# Patient Record
Sex: Male | Born: 1980 | Race: White | Hispanic: No | Marital: Single | Smoking: Current every day smoker
Health system: Southern US, Community
[De-identification: ages and names within clinical notes are randomized; demographics above are authoritative.]

## PROBLEM LIST (undated history)

## (undated) DIAGNOSIS — Z87442 Personal history of urinary calculi: Secondary | ICD-10-CM

## (undated) DIAGNOSIS — N200 Calculus of kidney: Secondary | ICD-10-CM

---

## 2006-12-03 ENCOUNTER — Emergency Department: Payer: Self-pay | Admitting: Internal Medicine

## 2006-12-05 ENCOUNTER — Observation Stay: Payer: Self-pay | Admitting: Unknown Physician Specialty

## 2006-12-05 ENCOUNTER — Emergency Department: Payer: Self-pay | Admitting: Emergency Medicine

## 2011-03-16 ENCOUNTER — Emergency Department: Payer: Self-pay | Admitting: Emergency Medicine

## 2012-10-07 ENCOUNTER — Emergency Department: Payer: Self-pay | Admitting: Emergency Medicine

## 2013-05-22 ENCOUNTER — Emergency Department: Payer: Self-pay | Admitting: Internal Medicine

## 2013-12-19 ENCOUNTER — Emergency Department: Payer: Self-pay | Admitting: Emergency Medicine

## 2014-06-10 ENCOUNTER — Emergency Department: Payer: Self-pay | Admitting: Emergency Medicine

## 2015-03-02 ENCOUNTER — Emergency Department
Admit: 2015-03-02 | Disposition: A | Discharge status: Home / Self Care | Payer: Self-pay | Admitting: Emergency Medicine

## 2015-03-02 LAB — URINALYSIS, COMPLETE
Bilirubin,UR: NEGATIVE
GLUCOSE, UR: NEGATIVE mg/dL (ref 0–75)
Leukocyte Esterase: NEGATIVE
NITRITE: NEGATIVE
PH: 5 (ref 4.5–8.0)
Protein: 30
RBC,UR: 306 /HPF (ref 0–5)
SPECIFIC GRAVITY: 1.025 (ref 1.003–1.030)
Squamous Epithelial: NONE SEEN
WBC UR: 1 /HPF (ref 0–5)

## 2015-03-02 LAB — COMPREHENSIVE METABOLIC PANEL
ALBUMIN: 4.3 g/dL
ANION GAP: 10 (ref 7–16)
AST: 20 U/L
Alkaline Phosphatase: 77 U/L
BUN: 9 mg/dL
Bilirubin,Total: 0.8 mg/dL
CALCIUM: 9.2 mg/dL
Chloride: 107 mmol/L
Co2: 23 mmol/L
Creatinine: 1.03 mg/dL
EGFR (Non-African Amer.): >60
Glucose: 125 mg/dL — ABNORMAL HIGH
POTASSIUM: 3.1 mmol/L — AB
SGPT (ALT): 16 U/L — ABNORMAL LOW
Sodium: 140 mmol/L
Total Protein: 6.9 g/dL

## 2015-03-02 LAB — CBC WITH DIFFERENTIAL/PLATELET
BASOS PCT: 1 %
Basophil #: 0.1 10*3/uL (ref 0.0–0.1)
EOS ABS: 0.1 10*3/uL (ref 0.0–0.7)
Eosinophil %: 1 %
HCT: 47.5 % (ref 40.0–52.0)
HGB: 15.6 g/dL (ref 13.0–18.0)
LYMPHS PCT: 38.8 %
Lymphocyte #: 4.2 10*3/uL — ABNORMAL HIGH (ref 1.0–3.6)
MCH: 29.6 pg (ref 26.0–34.0)
MCHC: 32.9 g/dL (ref 32.0–36.0)
MCV: 90 fL (ref 80–100)
MONO ABS: 0.9 x10 3/mm (ref 0.2–1.0)
Monocyte %: 8.1 %
Neutrophil #: 5.6 10*3/uL (ref 1.4–6.5)
Neutrophil %: 51.1 %
Platelet: 196 10*3/uL (ref 150–440)
RBC: 5.28 10*6/uL (ref 4.40–5.90)
RDW: 12.9 % (ref 11.5–14.5)
WBC: 10.9 10*3/uL — ABNORMAL HIGH (ref 3.8–10.6)

## 2015-03-02 LAB — LIPASE, BLOOD: Lipase: 49 U/L

## 2015-11-25 IMAGING — CT CT ABD-PELV W/ CM
2 of 4 series · 16 of 46 positions shown, 18 images · IV contrast (omnipaque)
Comparison: None.

CLINICAL DATA: Right lower quadrant pain since this morning.
Fevers, chills, no other complaints

EXAM:
CT ABDOMEN AND PELVIS WITH CONTRAST
TECHNIQUE: Multidetector CT imaging of the abdomen and pelvis was performed
using the standard protocol following bolus administration of
intravenous contrast.
CONTRAST:  100 mL Omnipaque 300

[Series 2: routine abd pel with · axial · 0.74mm/px · z∈[-402,+12]mm · 13 of 91 slices shown, 15 images]
[im 4/91  soft-tissue]
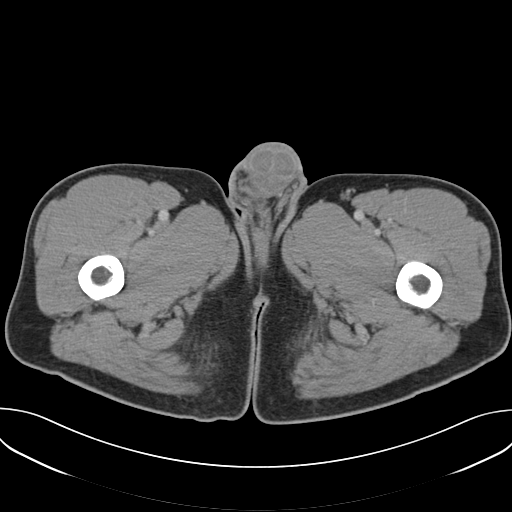
[im 4/91  bone]
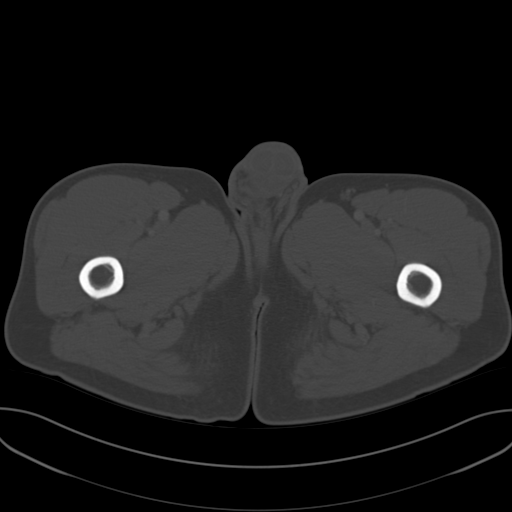
[im 11/91  soft-tissue]
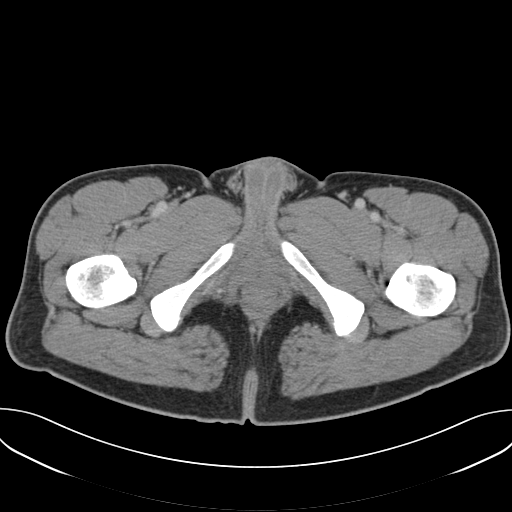
[im 19/91  soft-tissue]
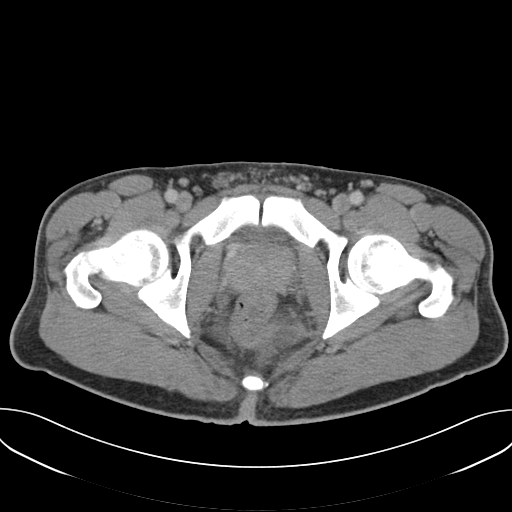
[im 26/91  soft-tissue]
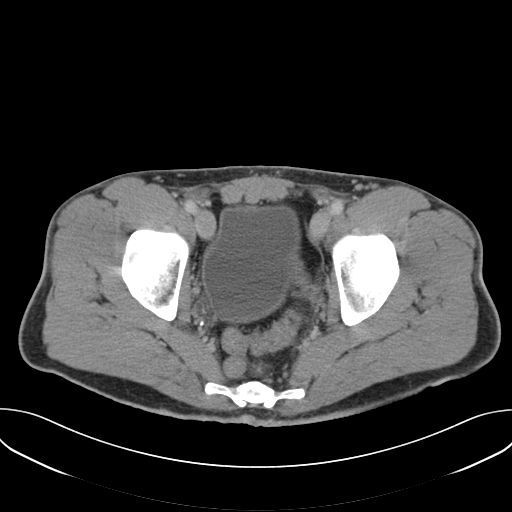
[im 33/91  soft-tissue]
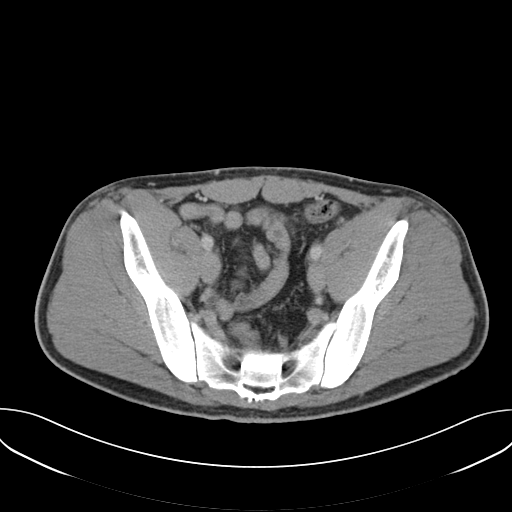
[im 40/91  soft-tissue]
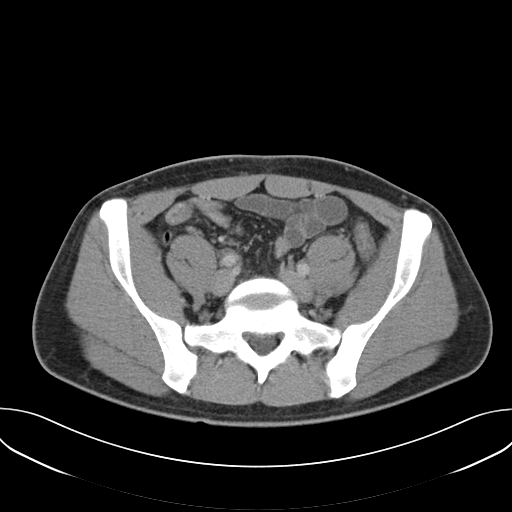
[im 47/91  soft-tissue]
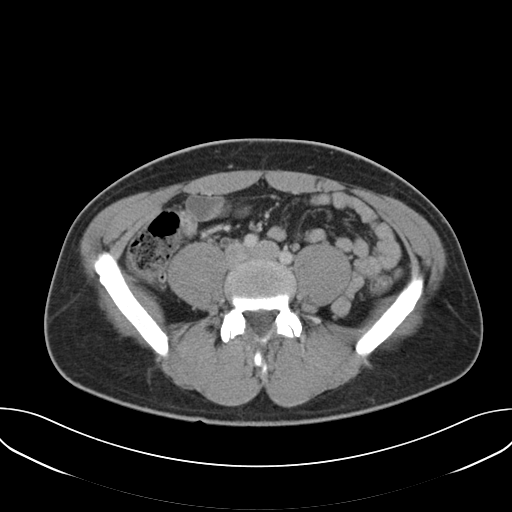
[im 51/91  soft-tissue]
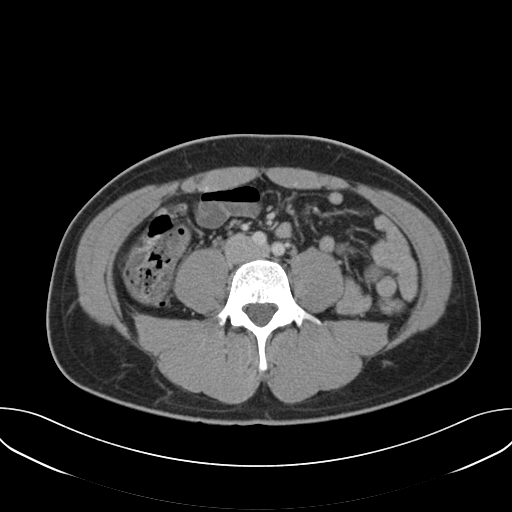
[im 58/91  soft-tissue]
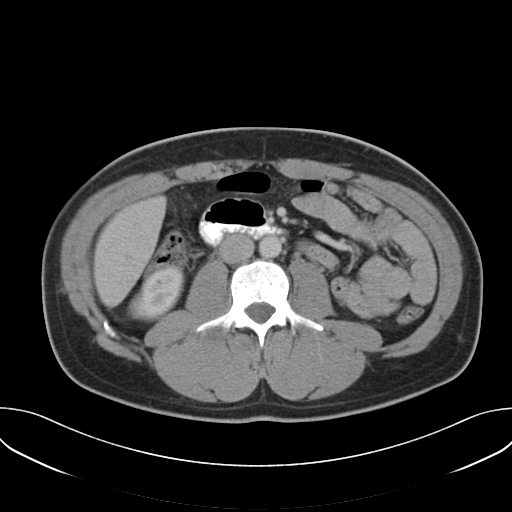
[im 58/91  bone]
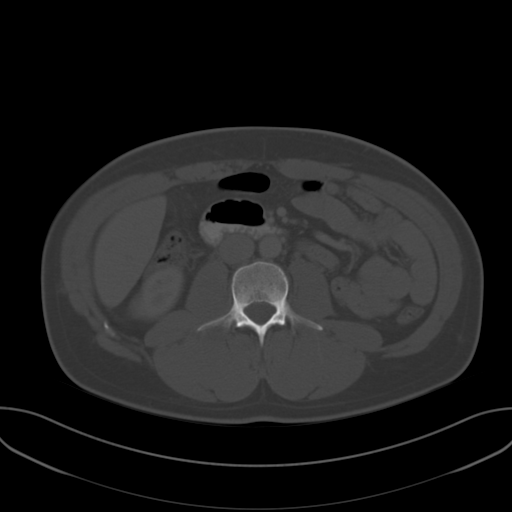
[im 65/91  soft-tissue]
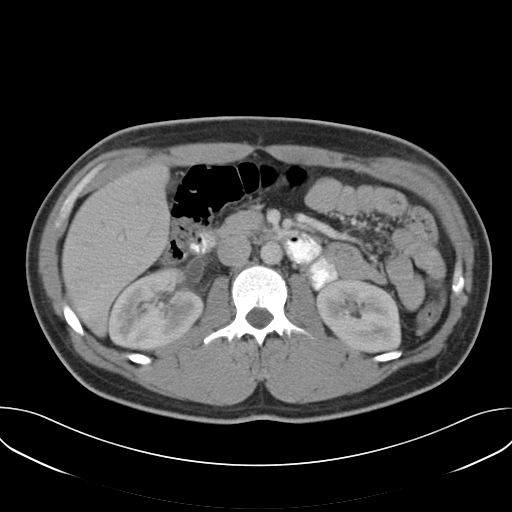
[im 73/91  soft-tissue]
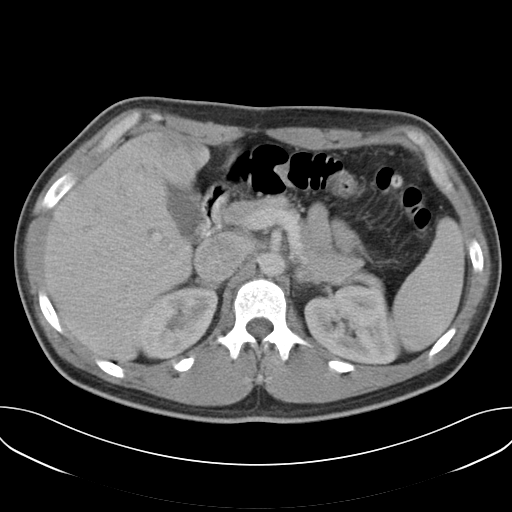
[im 80/91  soft-tissue]
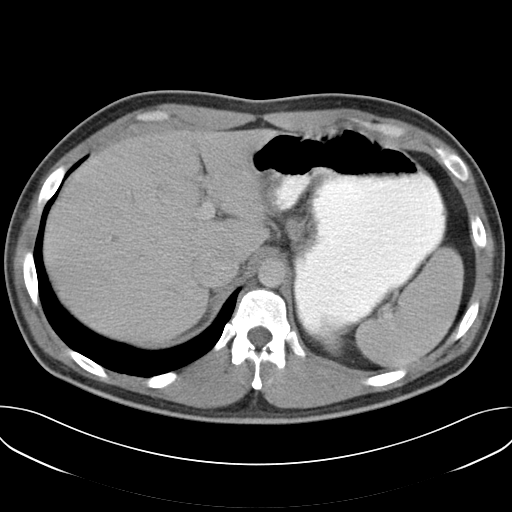
[im 87/91  soft-tissue]
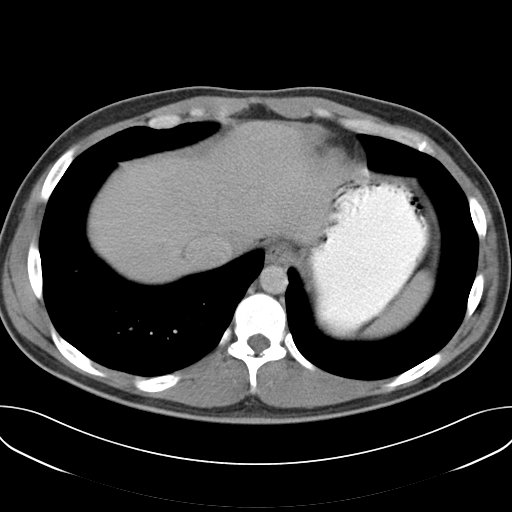

[Series 5: cor routine abd pel with · coronal · 0.70mm/px · 3 of 112 slices shown]
[im 38/112  soft-tissue]
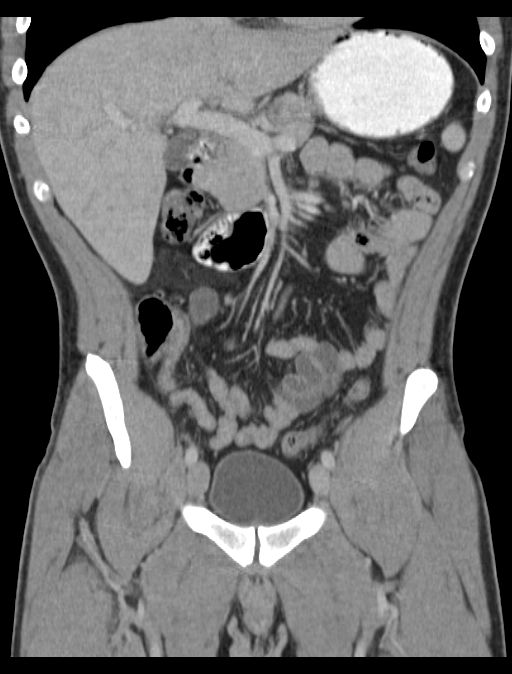
[im 50/112  soft-tissue]
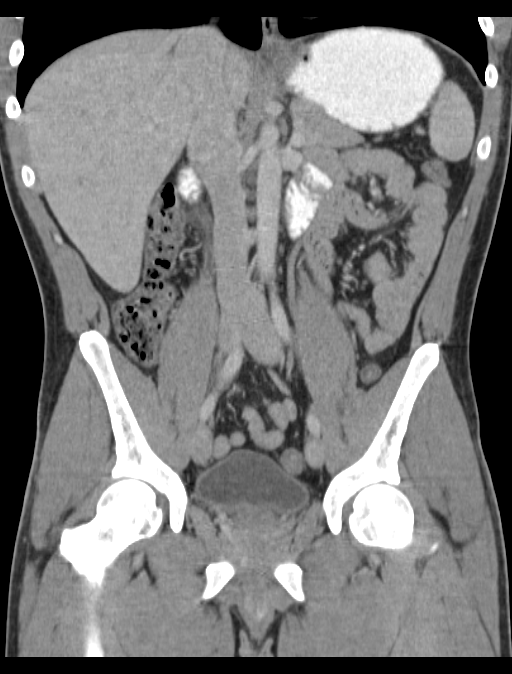
[im 62/112  soft-tissue]
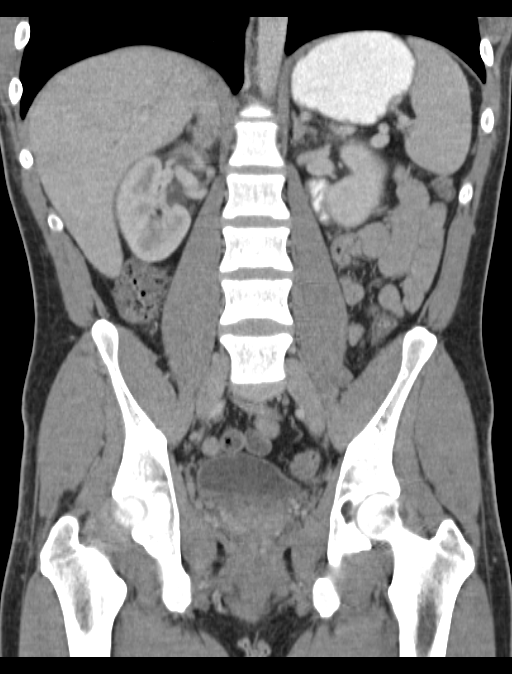

[16 of 46 positions shown; findings below may reference images not displayed]

FINDINGS: The lung bases are clear.

The liver demonstrates no focal abnormality. There is no
intrahepatic or extrahepatic biliary ductal dilatation. The
gallbladder is normal. The spleen demonstrates no focal
abnormality.There is mild right perinephric stranding and mild right
hydroureter with mucosal enhancement. The right kidney enhances
normally homogeneously. There is no urolithiasis. The left kidney,
adrenal glands and pancreas are normal. The bladder is unremarkable.

The stomach, duodenum, small intestine, and large intestine
demonstrate no bowel wall thickening or dilatation There is a normal
caliber appendix in the right lower quadrant without periappendiceal
inflammatory changes. There is no pneumoperitoneum, pneumatosis, or
portal venous gas. There is no abdominal or pelvic free fluid. There
is no lymphadenopathy.

The abdominal aorta is normal in caliber.

There are no lytic or sclerotic osseous lesions.
IMPRESSION: 1. Normal appendix.
2. Mild right perinephric stranding and mild right hydroureter.
Differential considerations include recently passed calculus versus
pyelonephritis. Correlate with laboratory values.

## 2016-01-10 DIAGNOSIS — K047 Periapical abscess without sinus: Secondary | ICD-10-CM | POA: Insufficient documentation

## 2016-01-10 DIAGNOSIS — F17210 Nicotine dependence, cigarettes, uncomplicated: Secondary | ICD-10-CM | POA: Insufficient documentation

## 2016-01-10 DIAGNOSIS — K0889 Other specified disorders of teeth and supporting structures: Secondary | ICD-10-CM | POA: Insufficient documentation

## 2016-01-10 DIAGNOSIS — K0381 Cracked tooth: Secondary | ICD-10-CM | POA: Insufficient documentation

## 2016-01-11 ENCOUNTER — Encounter: Payer: Self-pay | Admitting: Emergency Medicine

## 2016-01-11 ENCOUNTER — Emergency Department
Admission: EM | Admit: 2016-01-11 | Discharge: 2016-01-11 | Disposition: A | Discharge status: Home / Self Care | Payer: Self-pay | Attending: Emergency Medicine | Admitting: Emergency Medicine

## 2016-01-11 DIAGNOSIS — K0889 Other specified disorders of teeth and supporting structures: Secondary | ICD-10-CM

## 2016-01-11 DIAGNOSIS — K047 Periapical abscess without sinus: Secondary | ICD-10-CM

## 2016-01-11 MED ORDER — OXYCODONE-ACETAMINOPHEN 5-325 MG PO TABS: 1.0000 | ORAL_TABLET | ORAL | Status: DC | PRN

## 2016-01-11 MED ORDER — OXYCODONE-ACETAMINOPHEN 5-325 MG PO TABS
1.0000 | ORAL_TABLET | Freq: Once | ORAL | Status: AC
  Administered 2016-01-11: 1 via ORAL
  Filled 2016-01-11: qty 1

## 2016-01-11 MED ORDER — IBUPROFEN 800 MG PO TABS: 800.0000 mg | ORAL_TABLET | Freq: Three times a day (TID) | ORAL | Status: DC | PRN

## 2016-01-11 MED ORDER — PENICILLIN V POTASSIUM 500 MG PO TABS: 500.0000 mg | ORAL_TABLET | Freq: Four times a day (QID) | ORAL | Status: DC

## 2016-01-11 MED ORDER — IBUPROFEN 800 MG PO TABS
800.0000 mg | ORAL_TABLET | Freq: Once | ORAL | Status: AC
  Administered 2016-01-11: 800 mg via ORAL
  Filled 2016-01-11: qty 1

## 2016-01-11 MED ORDER — PENICILLIN V POTASSIUM 250 MG PO TABS
500.0000 mg | ORAL_TABLET | Freq: Once | ORAL | Status: AC
  Administered 2016-01-11: 500 mg via ORAL
  Filled 2016-01-11: qty 2

## 2016-01-11 NOTE — ED Provider Notes (Signed)
[facility] Provider Note  ____________________________________________  Time seen: Approximately 5:44 AM  I have reviewed the triage vital signs and the nursing notes.   HISTORY  Chief Complaint Dental Pain    HPI COPE MARTE is a 35 y.o. male who presents to the ED from home with a chief complaint of dental pain. Patient states he chipped his right lower tooth 2 nights ago on hard candy. Complains of pain and swelling to his right lower jaw. Denies fever, chills, chest pain, shortness of breath, abdominal pain, nausea, vomiting, diarrhea. Denies recent travel or trauma.   Past medical history None  There are no active problems to display for this patient.   History reviewed. No pertinent past surgical history.  No current outpatient prescriptions on file.  Allergies Review of patient's allergies indicates no known allergies.  No family history on file.  Social History Social History  Substance Use Topics  . Smoking status: Current Every Day Smoker -- 0.00 packs/day    Types: Cigarettes  . Smokeless tobacco: None  . Alcohol Use: No    Review of Systems Constitutional: No fever/chills Eyes: No visual changes. ENT: Positive for dental pain. No sore throat. Cardiovascular: Denies chest pain. Respiratory: Denies shortness of breath. Gastrointestinal: No abdominal pain.  No nausea, no vomiting.  No diarrhea.  No constipation. Genitourinary: Negative for dysuria. Musculoskeletal: Negative for back pain. Skin: Negative for rash. Neurological: Negative for headaches, focal weakness or numbness.  10-point ROS otherwise negative.  ____________________________________________   PHYSICAL EXAM:  VITAL SIGNS: ED Triage Vitals  Enc Vitals Group     BP 01/10/16 2358 146/96 mmHg     Pulse Rate 01/10/16 2358 58     Resp 01/10/16 2358 18     Temp 01/10/16 2358 98.1 F (36.7 C)     Temp Source 01/10/16 2358 Oral   SpO2 01/10/16 2358 100 %     Weight 01/10/16 2358 175 lb (79.379 kg)     Height 01/10/16 2358  (1.803 m)     Head Cir --      Peak Flow --      Pain Score 01/11/16 0007 9     Pain Loc --      Pain Edu? --      Excl. in [place]? --     Constitutional: Alert and oriented. Well appearing and in no acute distress. Eyes: Conjunctivae are normal. PERRL. EOMI. Head: Atraumatic. Nose: No congestion/rhinnorhea. Mouth/Throat: Mucous membranes are moist.  Oropharynx non-erythematous.  Right lower molar with fracture and missing piece. No abscess along the gumline. Right jaw with mild swelling. Neck: No stridor.   Cardiovascular: Normal rate, regular rhythm. Grossly normal heart sounds.  Good peripheral circulation. Respiratory: Normal respiratory effort.  No retractions. Lungs CTAB. Gastrointestinal: Soft and nontender. No distention. No abdominal bruits. No CVA tenderness. Musculoskeletal: No lower extremity tenderness nor edema.  No joint effusions. Neurologic:  Normal speech and language. No gross focal neurologic deficits are appreciated. No gait instability. Skin:  Skin is warm, dry and intact. No rash noted. Psychiatric: Mood and affect are normal. Speech and behavior are normal.  ____________________________________________   LABS (all labs ordered are listed, but only abnormal results are displayed)  Labs Reviewed - No data to display ____________________________________________  EKG  None ____________________________________________  RADIOLOGY  None ____________________________________________   PROCEDURES  Procedure(s) performed: None  Critical Care performed: No  ____________________________________________   INITIAL IMPRESSION / ASSESSMENT AND PLAN / ED COURSE  Pertinent labs & imaging results that were available during my care of the patient were reviewed by me and considered in my medical decision making (see chart for details).  35 year old male with  dentalgia secondary to broken tooth. Will treat with antibiotic, analgesia and patient to follow-up with dentist. He was given a list of local dental clinics to call for follow-up appointment. Strict return precautions given. Patient verbalizes understanding and agrees with plan of care. ____________________________________________   FINAL CLINICAL IMPRESSION(S) / ED DIAGNOSES  Final diagnoses:  Dentalgia  Dental abscess      Irean Hong, MD 01/11/16 [phone]

## 2016-01-11 NOTE — ED Notes (Signed)
Patient states that he chipped a tooth Friday while eating. Patient reports now with pain and swelling to right lower jaw.

## 2016-01-11 NOTE — Discharge Instructions (Signed)
1. Take antibiotic as prescribed (Penicillin VK  four times daily x 7 days). 2. Take pain medicines as needed (Motrin/Percocet #15). 3. Return to the ER for worsening symptoms, persistent vomiting, fever, difficulty breathing or other concerns.  Dental Abscess A dental abscess is a collection of pus in or around a tooth. CAUSES This condition is caused by a bacterial infection around the root of the tooth that involves the inner part of the tooth (pulp). It may result from:  Severe tooth decay.  Trauma to the tooth that allows bacteria to enter into the pulp, such as a broken or chipped tooth.  Severe gum disease around a tooth. SYMPTOMS Symptoms of this condition include:  Severe pain in and around the infected tooth.  Swelling and redness around the infected tooth, in the mouth, or in the face.  Tenderness.  Pus drainage.  Bad breath.  Bitter taste in the mouth.  Difficulty swallowing.  Difficulty opening the mouth.  Nausea.  Vomiting.  Chills.  Swollen neck glands.  Fever. DIAGNOSIS This condition is diagnosed with examination of the infected tooth. During the exam, your dentist may tap on the infected tooth. Your dentist will also ask about your medical and dental history and may order X-rays. TREATMENT This condition is treated by eliminating the infection. This may be done with:  Antibiotic medicine.  A root canal. This may be performed to save the tooth.  Pulling (extracting) the tooth. This may also involve draining the abscess. This is done if the tooth cannot be saved. HOME CARE INSTRUCTIONS  Take medicines only as directed by your dentist.  If you were prescribed antibiotic medicine, finish all of it even if you start to feel better.  Rinse your mouth (gargle) often with salt water to relieve pain or swelling.  Do not drive or operate heavy machinery while taking pain medicine.  Do not apply heat to the outside of your mouth.  Keep all  follow-up visits as directed by your dentist. This is important. SEEK MEDICAL CARE IF:  Your pain is worse and is not helped by medicine. SEEK IMMEDIATE MEDICAL CARE IF:  You have a fever or chills.  Your symptoms suddenly get worse.  You have a very bad headache.  You have problems breathing or swallowing.  You have trouble opening your mouth.  You have swelling in your neck or around your eye.   This information is not intended to replace advice given to you by your health care provider. Make sure you discuss any questions you have with your health care provider.   Document Released: 11/14/2005 Document Revised: 03/31/2015 Document Reviewed: 11/11/2014 Elsevier Interactive Patient Education 2016 [employer]  Dental Pain Dental pain may be caused by many things, including:  Tooth decay (cavities or caries). Cavities expose the nerve of your tooth to air and hot or cold temperatures. This can cause pain or discomfort.  Abscess or infection. A dental abscess is a collection of infected pus from a bacterial infection in the inner part of the tooth (pulp). It usually occurs at the end of the tooth's root.  Injury.  An unknown reason (idiopathic). Your pain may be mild or severe. It may only occur when:  You are chewing.  You are exposed to hot or cold temperature.  You are eating or drinking sugary foods or beverages, such as soda or candy. Your pain may also be constant. HOME CARE INSTRUCTIONS Watch your dental pain for any changes. The following actions may help  to lessen any discomfort that you are feeling:  Take medicines only as directed by your dentist.  If you were prescribed an antibiotic medicine, finish all of it even if you start to feel better.  Keep all follow-up visits as directed by your dentist. This is important.  Do not apply heat to the outside of your face.  Rinse your mouth or gargle with salt water if directed by your dentist. This helps with  pain and swelling.  You can make salt water by adding  tsp of salt to 1 cup of warm water.  Apply ice to the painful area of your face:  Put ice in a plastic bag.  Place a towel between your skin and the bag.  Leave the ice on for 20 minutes, 2-3 times per day.  Avoid foods or drinks that cause you pain, such as:  Very hot or very cold foods or drinks.  Sweet or sugary foods or drinks. SEEK MEDICAL CARE IF:  Your pain is not controlled with medicines.  Your symptoms are worse.  You have new symptoms. SEEK IMMEDIATE MEDICAL CARE IF:  You are unable to open your mouth.  You are having trouble breathing or swallowing.  You have a fever.  Your face, neck, or jaw is swollen.   This information is not intended to replace advice given to you by your health care provider. Make sure you discuss any questions you have with your health care provider.   Document Released: 11/14/2005 Document Revised: 03/31/2015 Document Reviewed: 11/10/2014 Elsevier Interactive Patient Education [employer]

## 2022-01-07 ENCOUNTER — Emergency Department: Payer: Self-pay

## 2022-01-07 ENCOUNTER — Encounter: Payer: Self-pay | Admitting: Emergency Medicine

## 2022-01-07 ENCOUNTER — Other Ambulatory Visit: Payer: Self-pay

## 2022-01-07 ENCOUNTER — Emergency Department
Admission: EM | Admit: 2022-01-07 | Discharge: 2022-01-07 | Disposition: A | Discharge status: Home / Self Care | Payer: Self-pay | Attending: Emergency Medicine | Admitting: Emergency Medicine

## 2022-01-07 DIAGNOSIS — R319 Hematuria, unspecified: Secondary | ICD-10-CM | POA: Insufficient documentation

## 2022-01-07 DIAGNOSIS — Z87891 Personal history of nicotine dependence: Secondary | ICD-10-CM | POA: Insufficient documentation

## 2022-01-07 DIAGNOSIS — N23 Unspecified renal colic: Secondary | ICD-10-CM | POA: Insufficient documentation

## 2022-01-07 DIAGNOSIS — I10 Essential (primary) hypertension: Secondary | ICD-10-CM | POA: Insufficient documentation

## 2022-01-07 DIAGNOSIS — N132 Hydronephrosis with renal and ureteral calculous obstruction: Secondary | ICD-10-CM | POA: Insufficient documentation

## 2022-01-07 DIAGNOSIS — R112 Nausea with vomiting, unspecified: Secondary | ICD-10-CM | POA: Insufficient documentation

## 2022-01-07 HISTORY — DX: Calculus of kidney: N20.0

## 2022-01-07 LAB — COMPREHENSIVE METABOLIC PANEL
ALT: 39 U/L (ref 0–44)
AST: 25 U/L (ref 15–41)
Albumin: 4.3 g/dL (ref 3.5–5.0)
Alkaline Phosphatase: 90 U/L (ref 38–126)
Anion gap: 9 (ref 5–15)
BUN: 19 mg/dL (ref 6–20)
CO2: 22 mmol/L (ref 22–32)
Calcium: 9.4 mg/dL (ref 8.9–10.3)
Chloride: 107 mmol/L (ref 98–111)
Creatinine, Ser: 1.32 mg/dL — ABNORMAL HIGH (ref 0.61–1.24)
GFR, Estimated: >60 mL/min (ref >60)
Glucose, Bld: 136 mg/dL — ABNORMAL HIGH (ref 70–99)
Potassium: 3.7 mmol/L (ref 3.5–5.1)
Sodium: 138 mmol/L (ref 135–145)
Total Bilirubin: 0.7 mg/dL (ref 0.3–1.2)
Total Protein: 7.2 g/dL (ref 6.5–8.1)

## 2022-01-07 LAB — CBC WITH DIFFERENTIAL/PLATELET
Abs Immature Granulocytes: 0.17 10*3/uL — ABNORMAL HIGH (ref 0.00–0.07)
Basophils Absolute: 0.1 10*3/uL (ref 0.0–0.1)
Basophils Relative: 0 %
Eosinophils Absolute: 0.1 10*3/uL (ref 0.0–0.5)
Eosinophils Relative: 0 %
HCT: 43.7 % (ref 39.0–52.0)
Hemoglobin: 15.4 g/dL (ref 13.0–17.0)
Immature Granulocytes: 1 %
Lymphocytes Relative: 20 %
Lymphs Abs: 3.7 10*3/uL (ref 0.7–4.0)
MCH: 30.9 pg (ref 26.0–34.0)
MCHC: 35.2 g/dL (ref 30.0–36.0)
MCV: 87.8 fL (ref 80.0–100.0)
Monocytes Absolute: 1.2 10*3/uL — ABNORMAL HIGH (ref 0.1–1.0)
Monocytes Relative: 7 %
Neutro Abs: 13.3 10*3/uL — ABNORMAL HIGH (ref 1.7–7.7)
Neutrophils Relative %: 72 %
Platelets: 258 10*3/uL (ref 150–400)
RBC: 4.98 MIL/uL (ref 4.22–5.81)
RDW: 11.9 % (ref 11.5–15.5)
WBC: 18.5 10*3/uL — ABNORMAL HIGH (ref 4.0–10.5)
nRBC: 0 % (ref 0.0–0.2)

## 2022-01-07 LAB — URINALYSIS, ROUTINE W REFLEX MICROSCOPIC
Bacteria, UA: NONE SEEN
Bilirubin Urine: NEGATIVE
Glucose, UA: NEGATIVE mg/dL
Ketones, ur: NEGATIVE mg/dL
Leukocytes,Ua: NEGATIVE
Nitrite: NEGATIVE
Protein, ur: NEGATIVE mg/dL
RBC / HPF: >50 RBC/hpf — ABNORMAL HIGH (ref 0–5)
Specific Gravity, Urine: 1.008 (ref 1.005–1.030)
pH: 5 (ref 5.0–8.0)

## 2022-01-07 MED ORDER — MORPHINE SULFATE (PF) 4 MG/ML IV SOLN
4.0000 mg | Freq: Once | INTRAVENOUS | Status: AC
  Administered 2022-01-07: 4 mg via INTRAVENOUS
  Filled 2022-01-07: qty 1

## 2022-01-07 MED ORDER — ONDANSETRON HCL 4 MG/2ML IJ SOLN
4.0000 mg | INTRAMUSCULAR | Status: AC
  Administered 2022-01-07: 4 mg via INTRAVENOUS
  Filled 2022-01-07: qty 2

## 2022-01-07 MED ORDER — ONDANSETRON 4 MG PO TBDP: ORAL_TABLET | ORAL | 0 refills | Status: AC

## 2022-01-07 MED ORDER — HYDROCODONE-ACETAMINOPHEN 5-325 MG PO TABS
2.0000 | ORAL_TABLET | Freq: Once | ORAL | Status: AC
  Administered 2022-01-07: 2 via ORAL
  Filled 2022-01-07: qty 2

## 2022-01-07 MED ORDER — OXYCODONE-ACETAMINOPHEN 5-325 MG PO TABS: 2.0000 | ORAL_TABLET | Freq: Three times a day (TID) | ORAL | 0 refills | Status: DC | PRN

## 2022-01-07 MED ORDER — LACTATED RINGERS IV BOLUS
1000.0000 mL | Freq: Once | INTRAVENOUS | Status: AC
  Administered 2022-01-07: 1000 mL via INTRAVENOUS

## 2022-01-07 MED ORDER — KETOROLAC TROMETHAMINE 30 MG/ML IJ SOLN
15.0000 mg | Freq: Once | INTRAMUSCULAR | Status: AC
  Administered 2022-01-07: 15 mg via INTRAVENOUS
  Filled 2022-01-07: qty 1

## 2022-01-07 NOTE — ED Provider Notes (Signed)
[facility] Provider Note    Event Date/Time   First MD Initiated Contact with Patient 01/07/22 0129     (approximate)   History   Abdominal Pain   HPI  [facility] is a 41 y.o. male with prior history of kidney stones as well as tobacco use but no other contributory past medical history.  He presents by EMS for evaluation of right-sided pain and pressure that radiates from his flank down to his pelvic area.  He has had more difficulty than usual urinating although he has been able to pee multiple times.  He said that it does not hurt to do so but sometimes it is darker than others.  It feels similar to prior kidney stones.  He has never required surgery to pass kidney stones.  He reports that the symptoms started about 2 days ago.  Yesterday he went to an urgent care and [facility].  They identified blood in his urine and started him on Flomax but no other medications.  By tonight, the episodes of pain were getting more frequent and became severe.  He has had nausea and vomiting associated with the symptoms.  No fever, chest pain, nor shortness of breath.  No dysuria.     Physical Exam   ED Triage Vitals  Enc Vitals Group     BP 01/07/22 0203 (!) 143/95     Pulse Rate 01/07/22 0203 86     Resp 01/07/22 0203 18     Temp --      Temp Source 01/07/22 0203 Oral     SpO2 01/07/22 0203 99 %     Weight 01/07/22 0050 100.7 kg (222 lb)     Height 01/07/22 0050 1.803 m (5\' 11" )     Head Circumference --      Peak Flow --      Pain Score 01/07/22 0050 9     Pain Loc --      Pain Edu? --      Excl. in [place]? --      Most recent vital signs: Vitals:   01/07/22 0203  BP: (!) 143/95  Pulse: 86  Resp: 18  SpO2: 99%     General: Awake, no distress.  Appears uncomfortable but not in severe distress. CV:  Good peripheral perfusion.  Resp:  Normal effort.  Abd:  No distention.  Mild tenderness to palpation of the right side of the abdomen with right  flank tenderness to percussion.   ED Results / Procedures / Treatments   Labs (all labs ordered are listed, but only abnormal results are displayed) Labs Reviewed  CBC WITH DIFFERENTIAL/PLATELET - Abnormal; Notable for the following components:      Result Value   WBC 18.5 (*)    Neutro Abs 13.3 (*)    Monocytes Absolute 1.2 (*)    Abs Immature Granulocytes 0.17 (*)    All other components within normal limits  URINALYSIS, ROUTINE W REFLEX MICROSCOPIC - Abnormal; Notable for the following components:   Color, Urine STRAW (*)    APPearance CLEAR (*)    Hgb urine dipstick LARGE (*)    RBC / HPF >50 (*)    All other components within normal limits  COMPREHENSIVE METABOLIC PANEL - Abnormal; Notable for the following components:   Glucose, Bld 136 (*)    Creatinine, Ser 1.32 (*)    All other components within normal limits    RADIOLOGY See hospital course for details -patient has  a 3 mm distal ureteral stone in the right UVJ.  Normal-appearing appendix.   PROCEDURES:  Critical Care performed: No  Procedures   MEDICATIONS ORDERED IN ED: Medications  HYDROcodone-acetaminophen (NORCO/VICODIN) 5-325 MG per tablet 2 tablet (has no administration in time range)  lactated ringers bolus 1,000 mL (0 mLs Intravenous Stopped 01/07/22 0301)  morphine (PF) 4 MG/ML injection 4 mg (4 mg Intravenous Given 01/07/22 0224)  ondansetron (ZOFRAN) injection 4 mg (4 mg Intravenous Given 01/07/22 0221)  ketorolac (TORADOL) 30 MG/ML injection 15 mg (15 mg Intravenous Given 01/07/22 0222)     IMPRESSION / MDM / ASSESSMENT AND PLAN / ED COURSE  I reviewed the triage vital signs and the nursing notes.                              Differential diagnosis includes, but is not limited to, renal/ureteral stone, UTI/pyelonephritis, infected stone, appendicitis.  The patient is on the cardiac monitor to evaluate for evidence of arrhythmia and/or significant heart rate changes.  Vital signs stable and  within normal limits other than very mild hypertension.  Patient is uncomfortable but nontoxic in appearance.  Labs ordered initially include urinalysis, comprehensive metabolic panel, and CBC.  I reviewed the patient's results.  His urinalysis demonstrates hematuria but no evidence of acute infection.  Comprehensive metabolic panel is essentially normal but he does have a very slight elevation of his creatinine of 1.32 which could represent some volume depletion/acute kidney injury in the setting of nausea and vomiting and acute pain.  CBC is notable for leukocytosis of 18.5.  I believe this is likely reactive given that he is young and otherwise healthy and has had several days of episodic pain that appears to be due to a ureteral stone.  Again, he has no infectious symptoms, and his urinalysis was reassuring.  A proximal infection is possible but unlikely in this particular case, particularly given his overall well appearance, afebrile, no tachycardia.  CT renal stone protocol was ordered.  I personally reviewed the images and identified what could be a ureteral stone.  The radiologist confirmed that the patient has a 3 mm distal right UVJ stone with moderate hydronephrosis.  No other concerning abnormalities including a normal-appearing appendix.  I have dated the patient regarding his diagnosis.  Plan of care is LR 1 L IV bolus, morphine 4 mg IV, Toradol 15 mg IV, and reassessment.  Anticipate discharge with outpatient follow-up.  Patient agrees with the current plan.  Clinical Course as of 01/07/22 0355  Fri Jan 07, 2022  [id number] The patient said he feels a lot better.  He can still feel a little bit of the pain but not much.  He is no longer nauseated or vomiting.  He has been adequately rehydrated, continues to have stable vital signs, and his pain is much improved.  I reiterated my usual kidney stone management and follow-up recommendations, and return precautions.  He understands and agrees  with the plan for discharge.  Medications as listed below including 2 Norco prior to discharge to try to tide him over until he can go to the pharmacy. [CF]    Clinical Course User Index [CF] Hinda Kehr, MD     FINAL CLINICAL IMPRESSION(S) / ED DIAGNOSES   Final diagnoses:  Renal colic on right side  Ureteral stone with hydronephrosis     Rx / DC Orders   ED Discharge Orders  Ordered    oxyCODONE-acetaminophen (PERCOCET) 5-325 MG tablet  Every 8 hours PRN        01/07/22 0355    ondansetron (ZOFRAN-ODT) 4 MG disintegrating tablet        01/07/22 0355             Note:  This document was prepared using Dragon voice recognition software and may include unintentional dictation errors.   Hinda Kehr, MD 01/07/22 [phone]

## 2022-01-07 NOTE — ED Notes (Signed)
Pt states he has been having flank pain and pain urinating since Wed. Pt does have a hx of kidney stone and denies having any surgery for it.

## 2022-01-07 NOTE — Discharge Instructions (Signed)
You have been seen in the Emergency Department (ED) today for pain caused by kidney stones.  As we have discussed, please drink plenty of fluids.  Please make a follow up appointment with the physician(s) listed elsewhere in this documentation.  You may take pain medication as needed but ONLY as prescribed.  Please also take your prescribed Flomax daily.  If you are going to follow up with Urology for possible lithotripsy, please do not take any ibuprofen, naproxen, aspirin, Toradol, or other NSAID after Tuesday morning, as this may exclude you from lithotripsy.  Please see your doctor as soon as possible as stones may take 1-3 weeks to pass and you may require additional care or medications.  Do not drink alcohol, drive or participate in any other potentially dangerous activities while taking opiate pain medication as it may make you sleepy. Do not take this medication with any other sedating medications, either prescription or over-the-counter. If you were prescribed Percocet or Vicodin, do not take these with acetaminophen (Tylenol) as it is already contained within these medications.   Take Percocet as needed for severe pain.  This medication is an opiate (or narcotic) pain medication and can be habit forming.  Use it as little as possible to achieve adequate pain control.  Do not use or use it with extreme caution if you have a history of opiate abuse or dependence.  If you are on a pain contract with your primary care doctor or a pain specialist, be sure to let them know you were prescribed this medication today from the [facility].  This medication is intended for your use only - do not give any to anyone else and keep it in a secure place where nobody else, especially children, have access to it.  It will also cause or worsen constipation, so you may want to consider taking an over-the-counter stool softener while you are taking this medication.  Return to the Emergency  Department (ED) or call your doctor if you have any worsening pain, fever, painful urination, are unable to urinate, or develop other symptoms that concern you.  

## 2022-01-07 NOTE — ED Triage Notes (Signed)
Pt to triage via w/c with no distress noted, brought in by EMS from home for c/o genital pain "pressure" x 2 days radiating into pelvic area accomp by urinary retention; rx flomax yesterday; hx kidney stone

## 2022-01-15 ENCOUNTER — Emergency Department
Admission: EM | Admit: 2022-01-15 | Discharge: 2022-01-15 | Disposition: A | Discharge status: Home / Self Care | Payer: Self-pay | Attending: Emergency Medicine | Admitting: Emergency Medicine

## 2022-01-15 ENCOUNTER — Other Ambulatory Visit: Payer: Self-pay

## 2022-01-15 ENCOUNTER — Encounter: Payer: Self-pay | Admitting: Emergency Medicine

## 2022-01-15 ENCOUNTER — Emergency Department: Payer: Self-pay

## 2022-01-15 DIAGNOSIS — F17200 Nicotine dependence, unspecified, uncomplicated: Secondary | ICD-10-CM | POA: Insufficient documentation

## 2022-01-15 DIAGNOSIS — N132 Hydronephrosis with renal and ureteral calculous obstruction: Secondary | ICD-10-CM | POA: Insufficient documentation

## 2022-01-15 DIAGNOSIS — N200 Calculus of kidney: Secondary | ICD-10-CM

## 2022-01-15 LAB — COMPREHENSIVE METABOLIC PANEL
ALT: 29 U/L (ref 0–44)
AST: 20 U/L (ref 15–41)
Albumin: 4.3 g/dL (ref 3.5–5.0)
Alkaline Phosphatase: 90 U/L (ref 38–126)
Anion gap: 7 (ref 5–15)
BUN: 14 mg/dL (ref 6–20)
CO2: 27 mmol/L (ref 22–32)
Calcium: 9.7 mg/dL (ref 8.9–10.3)
Chloride: 107 mmol/L (ref 98–111)
Creatinine, Ser: 1.06 mg/dL (ref 0.61–1.24)
GFR, Estimated: >60 mL/min (ref >60)
Glucose, Bld: 115 mg/dL — ABNORMAL HIGH (ref 70–99)
Potassium: 3.5 mmol/L (ref 3.5–5.1)
Sodium: 141 mmol/L (ref 135–145)
Total Bilirubin: 0.9 mg/dL (ref 0.3–1.2)
Total Protein: 7.3 g/dL (ref 6.5–8.1)

## 2022-01-15 LAB — URINALYSIS, COMPLETE (UACMP) WITH MICROSCOPIC
Bacteria, UA: NONE SEEN
Bilirubin Urine: NEGATIVE
Glucose, UA: NEGATIVE mg/dL
Ketones, ur: NEGATIVE mg/dL
Leukocytes,Ua: NEGATIVE
Nitrite: NEGATIVE
Protein, ur: 30 mg/dL — AB
Specific Gravity, Urine: 1.023 (ref 1.005–1.030)
Squamous Epithelial / HPF: NONE SEEN (ref 0–5)
pH: 5 (ref 5.0–8.0)

## 2022-01-15 LAB — CBC WITH DIFFERENTIAL/PLATELET
Abs Immature Granulocytes: 0.04 10*3/uL (ref 0.00–0.07)
Basophils Absolute: 0 10*3/uL (ref 0.0–0.1)
Basophils Relative: 0 %
Eosinophils Absolute: 0.3 10*3/uL (ref 0.0–0.5)
Eosinophils Relative: 2 %
HCT: 46.4 % (ref 39.0–52.0)
Hemoglobin: 15.9 g/dL (ref 13.0–17.0)
Immature Granulocytes: 0 %
Lymphocytes Relative: 33 %
Lymphs Abs: 3.4 10*3/uL (ref 0.7–4.0)
MCH: 30 pg (ref 26.0–34.0)
MCHC: 34.3 g/dL (ref 30.0–36.0)
MCV: 87.5 fL (ref 80.0–100.0)
Monocytes Absolute: 0.7 10*3/uL (ref 0.1–1.0)
Monocytes Relative: 7 %
Neutro Abs: 5.9 10*3/uL (ref 1.7–7.7)
Neutrophils Relative %: 58 %
Platelets: 309 10*3/uL (ref 150–400)
RBC: 5.3 MIL/uL (ref 4.22–5.81)
RDW: 11.7 % (ref 11.5–15.5)
WBC: 10.3 10*3/uL (ref 4.0–10.5)
nRBC: 0 % (ref 0.0–0.2)

## 2022-01-15 MED ORDER — KETOROLAC TROMETHAMINE 30 MG/ML IJ SOLN
15.0000 mg | Freq: Once | INTRAMUSCULAR | Status: AC
  Administered 2022-01-15: 15 mg via INTRAVENOUS
  Filled 2022-01-15: qty 1

## 2022-01-15 MED ORDER — ACETAMINOPHEN 500 MG PO TABS
1000.0000 mg | ORAL_TABLET | Freq: Once | ORAL | Status: AC
  Administered 2022-01-15: 1000 mg via ORAL
  Filled 2022-01-15: qty 2

## 2022-01-15 MED ORDER — OXYCODONE-ACETAMINOPHEN 5-325 MG PO TABS: 1.0000 | ORAL_TABLET | Freq: Three times a day (TID) | ORAL | 0 refills | Status: AC | PRN

## 2022-01-15 MED ORDER — MORPHINE SULFATE (PF) 4 MG/ML IV SOLN
4.0000 mg | Freq: Once | INTRAVENOUS | Status: AC
  Administered 2022-01-15: 4 mg via INTRAVENOUS
  Filled 2022-01-15: qty 1

## 2022-01-15 MED ORDER — ONDANSETRON HCL 4 MG/2ML IJ SOLN
4.0000 mg | Freq: Once | INTRAMUSCULAR | Status: AC
  Administered 2022-01-15: 4 mg via INTRAVENOUS
  Filled 2022-01-15: qty 2

## 2022-01-15 MED ORDER — LACTATED RINGERS IV BOLUS
1000.0000 mL | Freq: Once | INTRAVENOUS | Status: AC
  Administered 2022-01-15: 1000 mL via INTRAVENOUS

## 2022-01-15 NOTE — ED Triage Notes (Signed)
Pt via POV from home. Pt c/o RLQ pain that stated yesterday states that he was here on 2/10 dx with kidney stones, pt states he doesn't think he has passed any. Pt endorses nausea. Pt radiates down to his R testicle. Pt is A&OX4 and NAD.

## 2022-01-15 NOTE — ED Provider Notes (Signed)
[facility] Provider Note    Event Date/Time   First MD Initiated Contact with Patient 01/15/22 1042     (approximate)   History   Abdominal Pain   HPI  Zachary Huff is a 41 y.o. male with a past medical history of tobacco abuse and kidney stones most recently seen for acute right-sided low back pain radiating around right flank on 2/10 having been diagnosed with acute distal right ureteral stone 3 mm some mild right hydronephrosis discharged on Percocet presents for assessment of some persistent right-sided low back pain rating down the flank towards the groin associate with nausea and vomiting.  He states he has been taking Percocet and Flomax but this does not help much.  No fevers, chest pain, left-sided symptoms, cough, shortness of breath, headache, earache, sore throat or diarrhea.  States he sometimes feels like his whole brain and the urine but no gross blood.  No other acute concerns at this time.  He has not been taking any other pain medications.       Physical Exam  Triage Vital Signs: ED Triage Vitals [01/15/22 1038]  Enc Vitals Group     BP      Pulse      Resp      Temp      Temp src      SpO2      Weight 220 lb (99.8 kg)     Height 5\' 11"  (1.803 m)     Head Circumference      Peak Flow      Pain Score 10     Pain Loc      Pain Edu?      Excl. in [place]?     Most recent vital signs: Vitals:   01/15/22 1300 01/15/22 1315  BP: 112/79 112/79  Pulse: (!) 57 67  Resp: (!) 22 (!) 22  Temp:  97.9 F (36.6 C)  SpO2: 94% 98%    General: Awake, appears uncomfortable.  Dry mucous membranes. CV:  Good peripheral perfusion.  2+ radial pulses. Resp:  Normal effort.  Abd:  No distention.  Some mild right-sided lower quadrant tenderness and right flank tenderness and right CVA tenderness.  No overlying skin changes.  No rigidity or guarding.  No left-sided tenderness. Other:     ED Results / Procedures / Treatments  Labs (all labs  ordered are listed, but only abnormal results are displayed) Labs Reviewed  COMPREHENSIVE METABOLIC PANEL - Abnormal; Notable for the following components:      Result Value   Glucose, Bld 115 (*)    All other components within normal limits  URINALYSIS, COMPLETE (UACMP) WITH MICROSCOPIC - Abnormal; Notable for the following components:   Color, Urine YELLOW (*)    APPearance HAZY (*)    Hgb urine dipstick MODERATE (*)    Protein, ur 30 (*)    All other components within normal limits  CBC WITH DIFFERENTIAL/PLATELET     EKG  EKG is remarkable for sinus rhythm with a ventricular rate of 68, normal axis, unremarkable intervals with nonspecific ST change in lead III and aVF without other clear evidence of acute ischemia or significant arrhythmia.   RADIOLOGY  Renal ultrasound my interpretation system hydronephrosis without clear other processes.  Reviewed radiologist interpretation and agree the findings of some hydronephrosis and they make note of some possible hepatic steatosis as well without any other acute process.   PROCEDURES:  Critical Care performed: No  .  1-3 Lead EKG Interpretation Performed by: Lucrezia Starch, MD Authorized by: Lucrezia Starch, MD     Interpretation: normal     ECG rate assessment: normal     Rhythm: sinus rhythm     Ectopy: none     Conduction: normal    The patient is on the cardiac monitor to evaluate for evidence of arrhythmia and/or significant heart rate changes.   MEDICATIONS ORDERED IN ED: Medications  lactated ringers bolus 1,000 mL (0 mLs Intravenous Stopped 01/15/22 1304)  morphine (PF) 4 MG/ML injection 4 mg (4 mg Intravenous Given 01/15/22 1126)  ondansetron (ZOFRAN) injection 4 mg (4 mg Intravenous Given 01/15/22 1126)  ketorolac (TORADOL) 30 MG/ML injection 15 mg (15 mg Intravenous Given 01/15/22 1125)  morphine (PF) 4 MG/ML injection 4 mg (4 mg Intravenous Given 01/15/22 1316)  acetaminophen (TYLENOL) tablet 1,000 mg (1,000 mg  Oral Given 01/15/22 1316)     IMPRESSION / MDM / ASSESSMENT AND PLAN / ED COURSE  I reviewed the triage vital signs and the nursing notes.                              Differential diagnosis includes, but is not limited to ongoing symptoms related to kidney stone, pyelonephritis, diverticulitis, appendicitis and metabolic derangement such as kidney injury and dehydration.  EKG obtained in triage per protocol unless nonspecific change in lead III and aVF patient's family denying any associated chest pain myelosuppression for acute ACS event at this time.  Renal ultrasound my interpretation system hydronephrosis without clear other processes.  Reviewed radiologist interpretation and agree the findings of some hydronephrosis and they make note of some possible hepatic steatosis as well without any other acute process.  CBC shows no leukocytosis or acute anemia.  CMP without any significant electrolyte metabolic derangements.  No evidence of hepatitis or cholestatic process.  UA has some hemoglobin and protein but otherwise does not appear infected.  Overall I suspect likely ongoing pain related to kidney stone has not passed yet.  No new evidence of infection, AKI or any significant metabolic arrangement.  Discussed with on-call urologist Dr. Gloriann Loan who recommends follow-up with local urology clinic.  Information provided.  On reassessment patient is feeling much better and she will tolerate p.o.  Provide a refill of his Percocet and advised adding ibuprofen as well.  Discussed using plenty of fluids.  He has adequate Zofran at home.  He will return to emergency room for any worsening of symptoms.  I considered further observation at this time given improvement in pain and nausea with low suspicion for other immediate life-threatening process I think patient stable for discharge with close outpatient urology follow-up.  Discharged in stable condition.  Strict return precautions advised and discussed.       FINAL CLINICAL IMPRESSION(S) / ED DIAGNOSES   Final diagnoses:  Kidney stone     Rx / DC Orders   ED Discharge Orders          Ordered    oxyCODONE-acetaminophen (PERCOCET) 5-325 MG tablet  Every 8 hours PRN        01/15/22 1326             Note:  This document was prepared using Dragon voice recognition software and may include unintentional dictation errors.   Lucrezia Starch, MD 01/15/22 [phone]

## 2022-01-15 NOTE — ED Notes (Signed)
Ultrasound being done in room. Medications given for pain. Rates pain a 10.

## 2022-01-15 NOTE — ED Notes (Signed)
Patient states started having lower right sige back pain radiating to front of stomach. Rates pain 9. States has a hx of kidney stones. Alert and oriented.

## 2022-01-18 ENCOUNTER — Other Ambulatory Visit: Payer: Self-pay | Admitting: Urology

## 2022-01-18 ENCOUNTER — Encounter: Payer: Self-pay | Admitting: Urology

## 2022-01-18 ENCOUNTER — Ambulatory Visit (INDEPENDENT_AMBULATORY_CARE_PROVIDER_SITE_OTHER): Payer: Self-pay | Admitting: Urology

## 2022-01-18 ENCOUNTER — Other Ambulatory Visit: Payer: Self-pay

## 2022-01-18 VITALS — BP 139/96 | HR 78 | Ht 71.0 in | Wt 212.0 lb

## 2022-01-18 DIAGNOSIS — N200 Calculus of kidney: Secondary | ICD-10-CM

## 2022-01-18 DIAGNOSIS — N201 Calculus of ureter: Secondary | ICD-10-CM

## 2022-01-18 MED ORDER — KETOROLAC TROMETHAMINE 10 MG PO TABS: 10.0000 mg | ORAL_TABLET | Freq: Four times a day (QID) | ORAL | 0 refills | Status: AC | PRN

## 2022-01-18 NOTE — H&P (View-Only) (Signed)
° °  01/18/22 4:26 PM   Zachary Huff 1981/05/13 [id number]  CC: Right ureteral stone  HPI: I saw Zachary Huff today for evaluation of a 4 mm right distal ureteral stone.  He was originally seen at [facility] on 01/05/2022 and diagnosed with suspected ureterolithiasis based on UA and symptoms.  He had persistent symptoms that prompted visit to [facility] ER on 01/07/2022 where CT showed a 4 mm right distal ureteral stone and he was discharged with medical expulsive therapy.  He had persistent symptoms and return to the ER with pain on 01/15/2022 and ultrasound showed persistent hydronephrosis.  Urinalysis showed microscopic hematuria but no evidence of infection.  He continues to have right-sided groin pain that prevents him from being able to work.  He reports 1 prior stone episode a few years ago that passed spontaneously.   PMH: Past Medical History:  Diagnosis Date   Kidney stone     Surgical History: No past surgical history on file.  Family History: No family history on file.  Social History:  reports that he has been smoking cigarettes. He has never been exposed to tobacco smoke. He does not have any smokeless tobacco history on file. He reports that he does not drink alcohol. No history on file for drug use.  Physical Exam: BP (!) 139/96    Pulse 78    Ht 5\' 11"  (1.803 m)    Wt 212 lb (96.2 kg)    BMI 29.57 kg/m    Constitutional:  Alert and oriented, No acute distress. Cardiovascular: No clubbing, cyanosis, or edema. Respiratory: Normal respiratory effort, no increased work of breathing. GI: Abdomen is soft, nontender, nondistended, no abdominal masses   Laboratory Data: Reviewed, see HPI  Pertinent Imaging: I have personally viewed and interpreted the CT showing a 4 mm right distal ureteral stone with upstream hydronephrosis, no renal stones.  Assessment & Plan:   41 year old male with 4 mm right distal ureteral stone for at least 2 weeks and poorly controlled pain,  including 3 ER visits over the last 2 weeks.  No clinical or laboratory evidence of infection.  We discussed various treatment options for urolithiasis including observation with or without medical expulsive therapy, shockwave lithotripsy (SWL), ureteroscopy and laser lithotripsy with stent placement, and percutaneous nephrolithotomy.  We discussed that management is based on stone size, location, density, patient co-morbidities, and patient preference.   Stones <12mm in size have a >80% spontaneous passage rate. Data surrounding the use of tamsulosin for medical expulsive therapy is controversial, but meta analyses suggests it is most efficacious for distal stones between 5-64mm in size. Possible side effects include dizziness/lightheadedness, and retrograde ejaculation.  SWL has a lower stone free rate in a single procedure, but also a lower complication rate compared to ureteroscopy and avoids a stent and associated stent related symptoms. Possible complications include renal hematoma, steinstrasse, and need for additional treatment.  Ureteroscopy with laser lithotripsy and stent placement has a higher stone free rate than SWL in a single procedure, however increased complication rate including possible infection, ureteral injury, bleeding, and stent related morbidity. Common stent related symptoms include dysuria, urgency/frequency, and flank pain.  After an extensive discussion of the risks and benefits of the above treatment options, the patient would like to proceed with right ureteroscopy, laser lithotripsy, stent placement this week.  Toradol sent to pharmacy.   Nickolas Madrid, MD 01/18/2022  [facility] [address] [place], [place] [address] [phone]

## 2022-01-18 NOTE — Progress Notes (Signed)
° °  01/18/22 4:26 PM   Zachary Huff 01-17-1981 [id number]  CC: Right ureteral stone  HPI: I saw Mr. Standard today for evaluation of a 4 mm right distal ureteral stone.  He was originally seen at [facility] on 01/05/2022 and diagnosed with suspected ureterolithiasis based on UA and symptoms.  He had persistent symptoms that prompted visit to [facility] ER on 01/07/2022 where CT showed a 4 mm right distal ureteral stone and he was discharged with medical expulsive therapy.  He had persistent symptoms and return to the ER with pain on 01/15/2022 and ultrasound showed persistent hydronephrosis.  Urinalysis showed microscopic hematuria but no evidence of infection.  He continues to have right-sided groin pain that prevents him from being able to work.  He reports 1 prior stone episode a few years ago that passed spontaneously.   PMH: Past Medical History:  Diagnosis Date   Kidney stone     Surgical History: No past surgical history on file.  Family History: No family history on file.  Social History:  reports that he has been smoking cigarettes. He has never been exposed to tobacco smoke. He does not have any smokeless tobacco history on file. He reports that he does not drink alcohol. No history on file for drug use.  Physical Exam: BP (!) 139/96    Pulse 78    Ht 5\' 11"  (1.803 m)    Wt 212 lb (96.2 kg)    BMI 29.57 kg/m    Constitutional:  Alert and oriented, No acute distress. Cardiovascular: No clubbing, cyanosis, or edema. Respiratory: Normal respiratory effort, no increased work of breathing. GI: Abdomen is soft, nontender, nondistended, no abdominal masses   Laboratory Data: Reviewed, see HPI  Pertinent Imaging: I have personally viewed and interpreted the CT showing a 4 mm right distal ureteral stone with upstream hydronephrosis, no renal stones.  Assessment & Plan:   41 year old male with 4 mm right distal ureteral stone for at least 2 weeks and poorly controlled pain,  including 3 ER visits over the last 2 weeks.  No clinical or laboratory evidence of infection.  We discussed various treatment options for urolithiasis including observation with or without medical expulsive therapy, shockwave lithotripsy (SWL), ureteroscopy and laser lithotripsy with stent placement, and percutaneous nephrolithotomy.  We discussed that management is based on stone size, location, density, patient co-morbidities, and patient preference.   Stones <48mm in size have a >80% spontaneous passage rate. Data surrounding the use of tamsulosin for medical expulsive therapy is controversial, but meta analyses suggests it is most efficacious for distal stones between 5-4mm in size. Possible side effects include dizziness/lightheadedness, and retrograde ejaculation.  SWL has a lower stone free rate in a single procedure, but also a lower complication rate compared to ureteroscopy and avoids a stent and associated stent related symptoms. Possible complications include renal hematoma, steinstrasse, and need for additional treatment.  Ureteroscopy with laser lithotripsy and stent placement has a higher stone free rate than SWL in a single procedure, however increased complication rate including possible infection, ureteral injury, bleeding, and stent related morbidity. Common stent related symptoms include dysuria, urgency/frequency, and flank pain.  After an extensive discussion of the risks and benefits of the above treatment options, the patient would like to proceed with right ureteroscopy, laser lithotripsy, stent placement this week.  Toradol sent to pharmacy.   Nickolas Madrid, MD 01/18/2022  [facility] [address], [place], [place] [address] [phone]

## 2022-01-18 NOTE — Progress Notes (Signed)
[place] [facility] Posting Form   Surgery Date/Time: Date: 01/21/2022  Surgeon: Dr. Legrand Rams, MD  Surgery Location: Day Surgery  Inpt ( No  )   Outpt (Yes)   Obs ( No  )   Diagnosis: N20.1 Right Ureteral Stone  -CPT: [phone]  Surgery: Right Ureteroscopy with laser lithotripsy and stent placement   Stop Anticoagulations: No  Cardiac/Medical/Pulmonary Clearance needed: no  *Orders entered into EPIC  Date: 01/18/22   *Case booked in [place]  Date: 01/18/22  *Notified pt of Surgery: Date: 01/18/22  PRE-OP UA & CX: No  *Placed into Prior Authorization Work Angela Nevin Date: 01/18/22   Assistant/laser/rep:No

## 2022-01-18 NOTE — Progress Notes (Signed)
Surgical Physician Order [facility]  * Scheduling expectation :  Friday 2/24  *Length of Case: 30 minutes  *Clearance needed: no  *Anticoagulation Instructions: May continue all anticoagulants  *Aspirin Instructions: Ok to continue all  *Post-op visit Date/Instructions: TBD  *Diagnosis: Right Ureteral Stone  *Procedure: right Ureteroscopy w/laser lithotripsy & stent placement ([id number])   Additional orders: N/A  -Admit type: OUTpatient  -Anesthesia: General  -VTE Prophylaxis Standing Order SCDs       Other:   -Standing Lab Orders Per Anesthesia    Lab other: None  -Standing Test orders EKG/Chest x-ray per Anesthesia       Test other:   - Medications:  Ancef 2gm IV  -Other orders:  N/A

## 2022-01-18 NOTE — Patient Instructions (Signed)
Laser Therapy for Kidney Stones °Laser therapy for kidney stones is a procedure to break up small, hard mineral deposits that form in the kidney (kidney stones). The procedure is done using a device that produces a focused beam of light (laser). The laser breaks up kidney stones into pieces that are small enough to be passed out of the body through urination or removed from the body during the procedure. You may need laser therapy if you have kidney stones that are painful or block your urinary tract. °This procedure is done by inserting a tube (ureteroscope) into your kidney through the urethral opening. The urethra is the part of the body that drains urine from the bladder. In women, the urethra opens above the vaginal opening. In men, the urethra opens at the tip of the penis. The ureteroscope is inserted through the urethra, and surgical instruments are moved through the bladder and the muscular tube that connects the kidney to the bladder (ureter) until they reach the kidney. °Tell a health care provider about: °Any allergies you have. °All medicines you are taking, including vitamins, herbs, eye drops, creams, and over-the-counter medicines. °Any problems you or family members have had with anesthetic medicines. °Any blood disorders you have. °Any surgeries you have had. °Any medical conditions you have. °Whether you are pregnant or may be pregnant. °What are the risks? °Generally, this is a safe procedure. However, problems may occur, including: °Infection. °Bleeding. °Allergic reactions to medicines. °Damage to the urethra, bladder, or ureter. °Urinary tract infection (UTI). °Narrowing of the urethra (urethral stricture). °Difficulty passing urine. °Blockage of the kidney caused by a fragment of kidney stone. °What happens before the procedure? °Medicines °Ask your health care provider about: °Changing or stopping your regular medicines. This is especially important if you are taking diabetes medicines or  blood thinners. °Taking medicines such as aspirin and ibuprofen. These medicines can thin your blood. Do not take these medicines unless your health care provider tells you to take them. °Taking over-the-counter medicines, vitamins, herbs, and supplements. °Eating and drinking °Follow instructions from your health care provider about eating and drinking, which may include: °8 hours before the procedure - stop eating heavy meals or foods, such as meat, fried foods, or fatty foods. °6 hours before the procedure - stop eating light meals or foods, such as toast or cereal. °6 hours before the procedure - stop drinking milk or drinks that contain milk. °2 hours before the procedure - stop drinking clear liquids. °Staying hydrated °Follow instructions from your health care provider about hydration, which may include: °Up to 2 hours before the procedure - you may continue to drink clear liquids, such as water, clear fruit juice, black coffee, and plain tea. ° °General instructions °You may have a physical exam before the procedure. You may also have tests, such as imaging tests and blood or urine tests. °If your ureter is too narrow, your health care provider may place a soft, flexible tube (stent) inside of it. The stent may be placed days or weeks before your laser therapy procedure. °Plan to have someone take you home from the hospital or clinic. °If you will be going home right after the procedure, plan to have someone stay with you for 24 hours. °Do not use any products that contain nicotine or tobacco for at least 4 weeks before the procedure. These products include cigarettes, e-cigarettes, and chewing tobacco. If you need help quitting, ask your health care provider. °Ask your health care provider: °How your   surgical site will be marked or identified. °What steps will be taken to help prevent infection. These may include: °Removing hair at the surgery site. °Washing skin with a germ-killing soap. °Taking antibiotic  medicine. °What happens during the procedure? ° °An IV will be inserted into one of your veins. °You will be given one or more of the following: °A medicine to help you relax (sedative). °A medicine to numb the area (local anesthetic). °A medicine to make you fall asleep (general anesthetic). °A ureteroscope will be inserted into your urethra. The ureteroscope will send images to a video screen in the operating room to guide your surgeon to the area of your kidney that will be treated. °A small, flexible tube will be threaded through the ureteroscope and into your bladder and ureter, up to your kidney. °The laser device will be inserted into your kidney through the tube. Your surgeon will pulse the laser on and off to break up kidney stones. °A surgical instrument that has a tiny wire basket may be inserted through the tube into your kidney to remove the pieces of broken kidney stone. °The procedure may vary among health care providers and hospitals. °What happens after the procedure? °Your blood pressure, heart rate, breathing rate, and blood oxygen level will be monitored until you leave the hospital or clinic. °You will be given pain medicine as needed. °You may continue to receive antibiotics. °You may have a stent temporarily placed in your ureter. °Do not drive for 24 hours if you were given a sedative during your procedure. °You may be given a strainer to collect any stone fragments that you pass in your urine. Your health care provider may have these tested. °Summary °Laser therapy for kidney stones is a procedure to break up kidney stones into pieces that are small enough to be passed out of the body through urination or removed during the procedure. °Follow instructions from your health care provider about eating and drinking before the procedure. °During the procedure, the ureteroscope will send images to a video screen to guide your surgeon to the area of your kidney that will be treated. °Do not drive  for 24 hours if you were given a sedative during your procedure. °This information is not intended to replace advice given to you by your health care provider. Make sure you discuss any questions you have with your health care provider. °Document Revised: 07/19/2021 Document Reviewed: 07/19/2021 °Elsevier Patient Education © 2022 [employer] ° °Ureteral Stent Implantation °Ureteral stent implantation is a procedure to insert (implant) a flexible, soft, plastic tube (stent) into a ureter. Ureters are the tube-like parts of the body that drain urine from the kidneys. The stent supports the ureter while it heals and helps to drain urine. You may have a ureteral stent implanted after having a procedure to remove a blockage from the ureter (ureterolysis or pyeloplasty). You may also have a stent implanted to open the flow of urine when you have a blockage caused by a kidney stone, tumor, blood clot, or infection. °You have two ureters, one on each side of the body. The ureters connect the kidneys to the organ that holds urine until it passes out of the body (bladder). The stent is placed so that one end is in the kidney, and one end is in the bladder. The stent is usually taken out after your ureter has healed. Depending on your condition, you may have a stent for just a few weeks, or you may have   a long-term stent that will need to be replaced every few months. °Tell a health care provider about: °Any allergies you have. °All medicines you are taking, including vitamins, herbs, eye drops, creams, and over-the-counter medicines. °Any problems you or family members have had with anesthetic medicines. °Any blood disorders you have. °Any surgeries you have had. °Any medical conditions you have. °Whether you are pregnant or may be pregnant. °What are the risks? °Generally, this is a safe procedure. However, problems may occur, including: °Infection. °Bleeding. °Allergic reactions to medicines. °Damage to other structures  or organs. Tearing (perforation) of the ureter is possible. °Movement of the stent away from where it is placed during surgery (migration). °What happens before the procedure? °Medicines °Ask your health care provider about: °Changing or stopping your regular medicines. This is especially important if you are taking diabetes medicines or blood thinners. °Taking medicines such as aspirin and ibuprofen. These medicines can thin your blood. Do not take these medicines unless your health care provider tells you to take them. °Taking over-the-counter medicines, vitamins, herbs, and supplements. °Eating and drinking °Follow instructions from your health care provider about eating and drinking, which may include: °8 hours before the procedure - stop eating heavy meals or foods, such as meat, fried foods, or fatty foods. °6 hours before the procedure - stop eating light meals or foods, such as toast or cereal. °6 hours before the procedure - stop drinking milk or drinks that contain milk. °2 hours before the procedure - stop drinking clear liquids. °Staying hydrated °Follow instructions from your health care provider about hydration, which may include: °Up to 2 hours before the procedure - you may continue to drink clear liquids, such as water, clear fruit juice, black coffee, and plain tea. °General instructions °Do not drink alcohol. °Do not use any products that contain nicotine or tobacco for at least 4 weeks before the procedure. These products include cigarettes, e-cigarettes, and chewing tobacco. If you need help quitting, ask your health care provider. °You may have an exam or testing, such as imaging or blood tests. °Ask your health care provider what steps will be taken to help prevent infection. These may include: °Removing hair at the surgery site. °Washing skin with a germ-killing soap. °Taking antibiotic medicine. °Plan to have someone take you home from the hospital or clinic. °If you will be going home right  after the procedure, plan to have someone with you for 24 hours. °What happens during the procedure? °An IV will be inserted into one of your veins. °You may be given a medicine to help you relax (sedative). °You may be given a medicine to make you fall asleep (general anesthetic). °A thin, tube-shaped instrument with a light and tiny camera at the end (cystoscope) will be inserted into your urethra. The urethra is the tube that drains urine from the bladder out of the body. In men, the urethra opens at the end of the penis. In women, the urethra opens in front of the vaginal opening. °The cystoscope will be passed into your bladder. °A thin wire (guide wire) will be passed through your bladder and into your ureter. This is used to guide the stent into your ureter. °The stent will be inserted into your ureter. °The guide wire and the cystoscope will be removed. °A flexible tube (catheter) may be inserted through your urethra so that one end is in your bladder. This helps to drain urine from your bladder. °The procedure may vary among hospitals and   health care providers. °What happens after the procedure? °Your blood pressure, heart rate, breathing rate, and blood oxygen level will be monitored until you leave the hospital or clinic. °You may continue to receive medicine and fluids through an IV. °You may have some soreness or pain in your abdomen and urethra. Medicines will be available to help you. °You will be encouraged to get up and walk around as soon as you can. °You may have a catheter draining your urine. °You will have some blood in your urine. °Do not drive for 24 hours if you were given a sedative during your procedure. °Summary °Ureteral stent implantation is a procedure to insert a flexible, soft, plastic tube (stent) into a ureter. °You may have a stent implanted to support the ureter while it heals after a procedure or to open the flow of urine if there is a blockage. °Follow instructions from your  health care provider about taking medicines and about eating and drinking before the procedure. °Depending on your condition, you may have a stent for just a few weeks, or you may have a long-term stent that will need to be replaced every few months. °This information is not intended to replace advice given to you by your health care provider. Make sure you discuss any questions you have with your health care provider. °Document Revised: 08/21/2018 Document Reviewed: 08/22/2018 °Elsevier Patient Education © 2022 [employer] Inc. ° °

## 2022-01-20 ENCOUNTER — Other Ambulatory Visit: Payer: Self-pay

## 2022-01-20 ENCOUNTER — Encounter
Admission: RE | Admit: 2022-01-20 | Discharge: 2022-01-20 | Disposition: A | Discharge status: Home / Self Care | Payer: Self-pay | Source: Ambulatory Visit | Attending: Urology | Admitting: Urology

## 2022-01-20 HISTORY — DX: Personal history of urinary calculi: Z87.442

## 2022-01-20 NOTE — Patient Instructions (Addendum)
Your procedure is scheduled on: 01/21/22 Report to DAY SURGERY DEPARTMENT LOCATED ON 2ND FLOOR MEDICAL MALL ENTRANCE. To find out your arrival time please call [phone] between 1PM - 3PM on 01/20/22.  Remember: Instructions that are not followed completely may result in serious medical risk, up to and including death, or upon the discretion of your surgeon and anesthesiologist your surgery may need to be rescheduled.     _X__ 1. Do not eat food or drink any liquids after midnight the night before your procedure.                 No gum chewing or hard candies.   __X__2.  On the morning of surgery brush your teeth with toothpaste and water, you                 may rinse your mouth with mouthwash if you wish.  Do not swallow any              toothpaste of mouthwash.     _X__ 3.  No Alcohol for 24 hours before or after surgery.   _X__ 4.  Do Not Smoke or use e-cigarettes For 24 Hours Prior to Your Surgery.                 Do not use any chewable tobacco products for at least 6 hours prior to                 surgery.  ____  5.  Bring all medications with you on the day of surgery if instructed.   __X__  6.  Notify your doctor if there is any change in your medical condition      (cold, fever, infections).     Do not wear jewelry, make-up, hairpins, clips or nail polish. Do not wear lotions, powders, or perfumes.  Do not shave body hair 48 hours prior to surgery. Men may shave face and neck. Do not bring valuables to the hospital.    [facility] is not responsible for any belongings or valuables.  Contacts, dentures/partials or body piercings may not be worn into surgery. Bring a case for your contacts, glasses or hearing aids, a denture cup will be supplied. Leave your suitcase in the car. After surgery it may be brought to your room. For patients admitted to the hospital, discharge time is determined by your treatment team.   Patients discharged the day of surgery will not be  allowed to drive home.   Please read over the following fact sheets that you were given:     __X__ Take these medicines the morning of surgery with A SIP OF WATER:    1. none  2.   3.   4.  5.  6.  ____ Fleet Enema (as directed)   ____ Use CHG Soap/SAGE wipes as directed  ____ Use inhalers on the day of surgery  ____ Stop metformin/Janumet/Farxiga 2 days prior to surgery    ____ Take 1/2 of usual insulin dose the night before surgery. No insulin the morning          of surgery.   ____ Stop Blood Thinners Coumadin/Plavix/Xarelto/Pleta/Pradaxa/Eliquis/Effient/Aspirin  on   Or contact your Surgeon, Cardiologist or Medical Doctor regarding  ability to stop your blood thinners  __X__ Stop Anti-inflammatories 7 days before surgery such as Advil, Ibuprofen, Motrin,  BC or Goodies Powder, Naprosyn, Naproxen, Aleve, Aspirin    __X__ Stop all herbal supplements, fish oil or vitamin  E until after surgery.    ____ Bring C-Pap to the hospital.

## 2022-01-21 ENCOUNTER — Encounter
Admission: RE | Disposition: A | Discharge status: Home / Self Care | Payer: Self-pay | Source: Home / Self Care | Attending: Urology

## 2022-01-21 ENCOUNTER — Ambulatory Visit: Payer: Self-pay

## 2022-01-21 ENCOUNTER — Other Ambulatory Visit: Payer: Self-pay

## 2022-01-21 ENCOUNTER — Ambulatory Visit
Admission: RE | Admit: 2022-01-21 | Discharge: 2022-01-21 | Disposition: A | Discharge status: Home / Self Care | Payer: Self-pay | Attending: Urology | Admitting: Urology

## 2022-01-21 ENCOUNTER — Encounter: Payer: Self-pay | Admitting: Urology

## 2022-01-21 DIAGNOSIS — Z87442 Personal history of urinary calculi: Secondary | ICD-10-CM | POA: Insufficient documentation

## 2022-01-21 DIAGNOSIS — N201 Calculus of ureter: Secondary | ICD-10-CM

## 2022-01-21 DIAGNOSIS — N132 Hydronephrosis with renal and ureteral calculous obstruction: Secondary | ICD-10-CM | POA: Insufficient documentation

## 2022-01-21 DIAGNOSIS — F17210 Nicotine dependence, cigarettes, uncomplicated: Secondary | ICD-10-CM | POA: Insufficient documentation

## 2022-01-21 HISTORY — PX: CYSTOSCOPY/URETEROSCOPY/HOLMIUM LASER/STENT PLACEMENT: SHX6546

## 2022-01-21 SURGERY — CYSTOSCOPY/URETEROSCOPY/HOLMIUM LASER/STENT PLACEMENT
Anesthesia: General | Laterality: Right

## 2022-01-21 MED ORDER — LACTATED RINGERS IV SOLN
INTRAVENOUS | Status: DC | PRN
Start: 2022-01-21 — End: 2022-01-21

## 2022-01-21 MED ORDER — FENTANYL CITRATE (PF) 100 MCG/2ML IJ SOLN: 25.0000 ug | INTRAMUSCULAR | Status: DC | PRN

## 2022-01-21 MED ORDER — CEFAZOLIN SODIUM-DEXTROSE 2-4 GM/100ML-% IV SOLN: 2.0000 g | INTRAVENOUS | Status: DC

## 2022-01-21 MED ORDER — FAMOTIDINE 20 MG PO TABS: 20.0000 mg | ORAL_TABLET | Freq: Once | ORAL | Status: AC

## 2022-01-21 MED ORDER — PROPOFOL 10 MG/ML IV BOLUS
INTRAVENOUS | Status: DC | PRN
Start: 2022-01-21 — End: 2022-01-21
  Administered 2022-01-21: 200 mg via INTRAVENOUS

## 2022-01-21 MED ORDER — ACETAMINOPHEN 10 MG/ML IV SOLN: 1000.0000 mg | Freq: Once | INTRAVENOUS | Status: DC | PRN

## 2022-01-21 MED ORDER — ONDANSETRON HCL 4 MG/2ML IJ SOLN: 4.0000 mg | Freq: Once | INTRAMUSCULAR | Status: DC | PRN

## 2022-01-21 MED ORDER — OXYCODONE HCL 5 MG/5ML PO SOLN: 5.0000 mg | Freq: Once | ORAL | Status: AC | PRN

## 2022-01-21 MED ORDER — FENTANYL CITRATE (PF) 100 MCG/2ML IJ SOLN
INTRAMUSCULAR | Status: AC
  Filled 2022-01-21: qty 2

## 2022-01-21 MED ORDER — DEXAMETHASONE SODIUM PHOSPHATE 10 MG/ML IJ SOLN
INTRAMUSCULAR | Status: DC | PRN
  Administered 2022-01-21: 10 mg via INTRAVENOUS

## 2022-01-21 MED ORDER — OXYCODONE HCL 5 MG PO TABS
5.0000 mg | ORAL_TABLET | Freq: Once | ORAL | Status: AC | PRN
  Administered 2022-01-21: 5 mg via ORAL

## 2022-01-21 MED ORDER — CEFAZOLIN SODIUM-DEXTROSE 2-4 GM/100ML-% IV SOLN
INTRAVENOUS | Status: AC
  Filled 2022-01-21: qty 100

## 2022-01-21 MED ORDER — OXYCODONE HCL 5 MG PO TABS
ORAL_TABLET | ORAL | Status: AC
  Filled 2022-01-21: qty 1

## 2022-01-21 MED ORDER — LACTATED RINGERS IV SOLN: INTRAVENOUS | Status: DC

## 2022-01-21 MED ORDER — FENTANYL CITRATE (PF) 100 MCG/2ML IJ SOLN
INTRAMUSCULAR | Status: DC | PRN
Start: 2022-01-21 — End: 2022-01-21
  Administered 2022-01-21 (×2): 50 ug via INTRAVENOUS

## 2022-01-21 MED ORDER — DEXMEDETOMIDINE HCL IN NACL 200 MCG/50ML IV SOLN
INTRAVENOUS | Status: DC | PRN
  Administered 2022-01-21: 20 ug via INTRAVENOUS

## 2022-01-21 MED ORDER — ONDANSETRON HCL 4 MG/2ML IJ SOLN
INTRAMUSCULAR | Status: DC | PRN
  Administered 2022-01-21: 4 mg via INTRAVENOUS

## 2022-01-21 MED ORDER — FAMOTIDINE 20 MG PO TABS
ORAL_TABLET | ORAL | Status: AC
  Administered 2022-01-21: 20 mg via ORAL
  Filled 2022-01-21: qty 1

## 2022-01-21 MED ORDER — KETOROLAC TROMETHAMINE 30 MG/ML IJ SOLN
INTRAMUSCULAR | Status: DC | PRN
Start: 2022-01-21 — End: 2022-01-21
  Administered 2022-01-21: 30 mg via INTRAVENOUS

## 2022-01-21 MED ORDER — HYDROCODONE-ACETAMINOPHEN 5-325 MG PO TABS: 1.0000 | ORAL_TABLET | Freq: Four times a day (QID) | ORAL | 0 refills | Status: AC | PRN

## 2022-01-21 MED ORDER — ORAL CARE MOUTH RINSE: 15.0000 mL | Freq: Once | OROMUCOSAL | Status: AC

## 2022-01-21 MED ORDER — IOHEXOL 180 MG/ML  SOLN
INTRAMUSCULAR | Status: DC | PRN
  Administered 2022-01-21: 10 mL

## 2022-01-21 MED ORDER — CHLORHEXIDINE GLUCONATE 0.12 % MT SOLN
OROMUCOSAL | Status: AC
Start: 2022-01-21 — End: 2022-01-21
  Administered 2022-01-21: 15 mL via OROMUCOSAL
  Filled 2022-01-21: qty 15

## 2022-01-21 MED ORDER — ROCURONIUM BROMIDE 100 MG/10ML IV SOLN
INTRAVENOUS | Status: DC | PRN
  Administered 2022-01-21: 30 mg via INTRAVENOUS

## 2022-01-21 MED ORDER — CHLORHEXIDINE GLUCONATE 0.12 % MT SOLN: 15.0000 mL | Freq: Once | OROMUCOSAL | Status: AC

## 2022-01-21 MED ORDER — SUGAMMADEX SODIUM 200 MG/2ML IV SOLN
INTRAVENOUS | Status: DC | PRN
  Administered 2022-01-21: 200 mg via INTRAVENOUS

## 2022-01-21 SURGICAL SUPPLY — 33 items
ADH LQ OCL WTPRF AMP STRL LF (MISCELLANEOUS)
ADHESIVE MASTISOL STRL (MISCELLANEOUS) IMPLANT
BAG DRAIN CYSTO-URO LG1000N (MISCELLANEOUS) ×2 IMPLANT
BRUSH SCRUB EZ 1% IODOPHOR (MISCELLANEOUS) ×2 IMPLANT
CATH URET FLEX-TIP 2 LUMEN 10F (CATHETERS) IMPLANT
CATH URETL OPEN 5X70 (CATHETERS) IMPLANT
CNTNR SPEC 2.5X3XGRAD LEK (MISCELLANEOUS)
CONT SPEC 4OZ STER OR WHT (MISCELLANEOUS)
CONT SPEC 4OZ STRL OR WHT (MISCELLANEOUS)
CONTAINER SPEC 2.5X3XGRAD LEK (MISCELLANEOUS) IMPLANT
DRAPE UTILITY 15X26 TOWEL STRL (DRAPES) ×2 IMPLANT
DRSG TEGADERM 2-3/8X2-3/4 SM (GAUZE/BANDAGES/DRESSINGS) IMPLANT
GAUZE 4X4 16PLY ~~LOC~~+RFID DBL (SPONGE) ×4 IMPLANT
GLOVE SURG UNDER POLY LF SZ7.5 (GLOVE) ×2 IMPLANT
GOWN STRL REUS W/ TWL LRG LVL3 (GOWN DISPOSABLE) ×1 IMPLANT
GOWN STRL REUS W/ TWL XL LVL3 (GOWN DISPOSABLE) ×1 IMPLANT
GOWN STRL REUS W/TWL LRG LVL3 (GOWN DISPOSABLE) ×2
GOWN STRL REUS W/TWL XL LVL3 (GOWN DISPOSABLE) ×2
GUIDEWIRE STR DUAL SENSOR (WIRE) ×2 IMPLANT
INFUSOR MANOMETER BAG 3000ML (MISCELLANEOUS) ×2 IMPLANT
IV NS IRRIG 3000ML ARTHROMATIC (IV SOLUTION) ×2 IMPLANT
KIT TURNOVER CYSTO (KITS) ×2 IMPLANT
PACK CYSTO AR (MISCELLANEOUS) ×2 IMPLANT
SET CYSTO W/LG BORE CLAMP LF (SET/KITS/TRAYS/PACK) ×2 IMPLANT
SHEATH URETERAL 12FRX35CM (MISCELLANEOUS) IMPLANT
STENT URET 6FRX24 CONTOUR (STENTS) IMPLANT
STENT URET 6FRX26 CONTOUR (STENTS) ×1 IMPLANT
SURGILUBE 2OZ TUBE FLIPTOP (MISCELLANEOUS) ×2 IMPLANT
SYR 10ML LL (SYRINGE) ×2 IMPLANT
TRACTIP FLEXIVA PULSE ID 200 (Laser) ×1 IMPLANT
VALVE UROSEAL ADJ ENDO (VALVE) ×1 IMPLANT
WATER STERILE IRR 1000ML POUR (IV SOLUTION) ×2 IMPLANT
WATER STERILE IRR 500ML POUR (IV SOLUTION) ×2 IMPLANT

## 2022-01-21 NOTE — Anesthesia Preprocedure Evaluation (Addendum)
Anesthesia Evaluation  Patient identified by MRN, date of birth, ID band Patient awake    Reviewed: Allergy & Precautions, NPO status , Patient's Chart, lab work & pertinent test results  History of Anesthesia Complications Negative for: history of anesthetic complications  Airway Mallampati: I   Neck ROM: Full    Dental  (+) Poor Dentition, Chipped, Dental Advidsory Given,    Pulmonary Current Smoker (1/3 ppd) and Patient abstained from smoking.,    Pulmonary exam normal breath sounds clear to auscultation       Cardiovascular Exercise Tolerance: Good negative cardio ROS Normal cardiovascular exam Rhythm:Regular Rate:Normal  ECG 01/15/22:  Sinus rhythm Minimal ST depression, inferior leads ST elevation, consider lateral injury Baseline wander in lead(s) V2   Neuro/Psych negative neurological ROS     GI/Hepatic negative GI ROS,   Endo/Other  negative endocrine ROS  Renal/GU Renal disease (nephrolithiasis)     Musculoskeletal   Abdominal   Peds  Hematology negative hematology ROS (+)   Anesthesia Other Findings Past Medical History: No date: History of kidney stones   Reproductive/Obstetrics                           Anesthesia Physical Anesthesia Plan  ASA: 2  Anesthesia Plan: General   Post-op Pain Management:    Induction: Intravenous  PONV Risk Score and Plan: 1 and Ondansetron, Dexamethasone and Treatment may vary due to age or medical condition  Airway Management Planned: Oral ETT  Additional Equipment:   Intra-op Plan:   Post-operative Plan: Extubation in OR  Informed Consent: I have reviewed the patients History and Physical, chart, labs and discussed the procedure including the risks, benefits and alternatives for the proposed anesthesia with the patient or authorized representative who has indicated his/her understanding and acceptance.     Dental advisory  given  Plan Discussed with: CRNA  Anesthesia Plan Comments: (Patient consented for risks of anesthesia including but not limited to:  - adverse reactions to medications - damage to eyes, teeth, lips or other oral mucosa - nerve damage due to positioning  - sore throat or hoarseness - damage to heart, brain, nerves, lungs, other parts of body or loss of life  Informed patient about role of CRNA in peri- and intra-operative care.  Patient voiced understanding.)        Anesthesia Quick Evaluation

## 2022-01-21 NOTE — Interval H&P Note (Signed)
History and Physical Interval Note:  UROLOGY H&P UPDATE  Agree with prior H&P dated 01/18/22, 62mm right distal ureteral stone and ongoing renal colic, multiple ER visits.  Cardiac: RRR Lungs: CTA bilaterally  Laterality: right Procedure: right ureteroscopy, laser lithotripsy, stent  Urine: UA with microscopic hematuria, otherwise benign  We specifically discussed the risks ureteroscopy including bleeding, infection/sepsis, stent related symptoms including flank pain/urgency/frequency/incontinence/dysuria, ureteral injury, inability to access stone, or need for staged or additional procedures.   Billey Co, MD 01/21/2022

## 2022-01-21 NOTE — Transfer of Care (Signed)
Immediate Anesthesia Transfer of Care Note  Patient: Zachary Huff  Procedure(s) Performed: CYSTOSCOPY/URETEROSCOPY/HOLMIUM LASER/STENT PLACEMENT (Right)  Patient Location: PACU  Anesthesia Type:General  Level of Consciousness: sedated  Airway & Oxygen Therapy: Patient Spontanous Breathing and Patient connected to face mask oxygen  Post-op Assessment: Report given to RN and Post -op Vital signs reviewed and stable  Post vital signs: Reviewed and stable  Last Vitals:  Vitals Value Taken Time  BP 122/75 01/21/22 1523  Temp 36.5 C 01/21/22 1523  Pulse 56 01/21/22 1525  Resp 20 01/21/22 1525  SpO2 100 % 01/21/22 1525  Vitals shown include unvalidated device data.  Last Pain:  Vitals:   01/21/22 1523  TempSrc:   PainSc: Asleep         Complications: No notable events documented.

## 2022-01-21 NOTE — Anesthesia Procedure Notes (Signed)
Procedure Name: Intubation Date/Time: 01/21/2022 2:50 PM Performed by: Beverely Low, CRNA Pre-anesthesia Checklist: Patient identified, Patient being monitored, Timeout performed, Emergency Drugs available and Suction available Patient Re-evaluated:Patient Re-evaluated prior to induction Oxygen Delivery Method: Circle system utilized Preoxygenation: Pre-oxygenation with 100% oxygen Induction Type: IV induction Ventilation: Mask ventilation without difficulty Laryngoscope Size: Mac and 3 Grade View: Grade I Tube type: Oral Tube size: 7.0 mm Number of attempts: 1 Placement Confirmation: ETT inserted through vocal cords under direct vision, positive ETCO2 and breath sounds checked- equal and bilateral Secured at: 21 cm Tube secured with: Tape Dental Injury: Teeth and Oropharynx as per pre-operative assessment

## 2022-01-21 NOTE — Op Note (Signed)
Date of procedure: 01/21/22  Preoperative diagnosis:  Right distal ureteral stone  Postoperative diagnosis:  Same  Procedure: Cystoscopy, right ureteroscopy, laser lithotripsy, right retrograde pyelogram with intraoperative interpretation, right ureteral stent placement  Surgeon: Legrand Rams, MD  Anesthesia: General  Complications: None  Intraoperative findings:  Normal cystoscopy, uncomplicated dusting of right distal ureteral stone, all fragments irrigated free of ureter, stent placed with Dangler  EBL: Minimal  Specimens: None  Drains: Right 6 French by 26 cm ureteral stent with Dangler  Indication: Zachary Huff is a 41 y.o. patient with 5 mm right distal ureteral stone and ongoing renal colic with multiple ER visits who opted for ureteroscopy.  After reviewing the management options for treatment, they elected to proceed with the above surgical procedure(s). We have discussed the potential benefits and risks of the procedure, side effects of the proposed treatment, the likelihood of the patient achieving the goals of the procedure, and any potential problems that might occur during the procedure or recuperation. Informed consent has been obtained.  Description of procedure:  The patient was taken to the operating room and general anesthesia was induced. SCDs were placed for DVT prophylaxis. The patient was placed in the dorsal lithotomy position, prepped and draped in the usual sterile fashion, and preoperative antibiotics(Ancef) were administered. A preoperative time-out was performed.   A 21 French rigid cystoscope was used to intubate the urethra and a normal-appearing urethra was followed proximally into the bladder.  The prostate was short and open appearing.  Thorough cystoscopy showed no suspicious lesions, the ureteral orifices were orthotopic bilaterally.    A sensor wire passed easily into the right ureteral orifice up to the kidney under fluoroscopic vision.  A  semirigid ureteroscope advanced easily alongside the wire and identified a yellow and black 5 mm stone in the distal ureter.  Using a 242 m laser fiber on settings of 1.0 J and 10 Hz this was fragmented into multiple pieces, and these were all irrigated free from the ureter.  Careful inspection of the ureter revealed no residual fragments, but some mild to moderate edema in the right distal ureter.  A retrograde pyelogram was performed from the mid ureter which showed no extravasation or filling defects.  The rigid cystoscope was backloaded over the wire, and a 6 [place] by 26 cm ureteral stent was uneventfully placed with a curl in the renal pelvis, as well as under direct vision in the bladder.  Fluid drained through the side ports of the stent.  The bladder was drained, and the Dangler was secured to the dorsal aspect of the penis using Mastisol and Tegaderm.  Disposition: Stable to PACU  Plan: Remove stent at home on Tuesday morning, can follow-up with urology as needed  Legrand Rams, MD

## 2022-01-21 NOTE — Discharge Instructions (Signed)
AMBULATORY SURGERY  ?DISCHARGE INSTRUCTIONS ? ? ?The drugs that you were given will stay in your system until tomorrow so for the next 24 hours you should not: ? ?Drive an automobile ?Make any legal decisions ?Drink any alcoholic beverage ? ? ?You may resume regular meals tomorrow.  Today it is better to start with liquids and gradually work up to solid foods. ? ?You may eat anything you prefer, but it is better to start with liquids, then soup and crackers, and gradually work up to solid foods. ? ? ?Please notify your doctor immediately if you have any unusual bleeding, trouble breathing, redness and pain at the surgery site, drainage, fever, or pain not relieved by medication. ? ? ? ?Additional Instructions: ? ? ? ?Please contact your physician with any problems or Same Day Surgery at [phone], Monday through Friday 6 am to 4 pm, or [place] [facility] [place] Main number at [phone].  ?

## 2022-01-24 ENCOUNTER — Encounter: Payer: Self-pay | Admitting: Urology

## 2022-01-24 NOTE — Anesthesia Postprocedure Evaluation (Signed)
Anesthesia Post Note  Patient: Zachary Huff  Procedure(s) Performed: CYSTOSCOPY/URETEROSCOPY/HOLMIUM LASER/STENT PLACEMENT (Right)  Patient location during evaluation: PACU Anesthesia Type: General Level of consciousness: awake and alert Pain management: pain level controlled Vital Signs Assessment: post-procedure vital signs reviewed and stable Respiratory status: spontaneous breathing, nonlabored ventilation, respiratory function stable and patient connected to nasal cannula oxygen Cardiovascular status: blood pressure returned to baseline and stable Postop Assessment: no apparent nausea or vomiting Anesthetic complications: no   No notable events documented.   Last Vitals:  Vitals:   01/21/22 1555 01/21/22 1606  BP: (!) 133/95 131/89  Pulse: 73 (!) 54  Resp: 15 18  Temp: 36.6 C (!) 36.2 C  SpO2: 100% 100%    Last Pain:  Vitals:   01/21/22 1606  TempSrc: Temporal  PainSc: 0-No pain                 Cleda Mccreedy Welden Hausmann

## 2022-01-25 ENCOUNTER — Other Ambulatory Visit: Payer: Self-pay

## 2022-01-25 ENCOUNTER — Ambulatory Visit (INDEPENDENT_AMBULATORY_CARE_PROVIDER_SITE_OTHER): Payer: Self-pay | Admitting: Family Medicine

## 2022-01-25 DIAGNOSIS — N201 Calculus of ureter: Secondary | ICD-10-CM

## 2022-01-25 NOTE — Progress Notes (Signed)
Patient presents today for a stent removal. Patient sat on the bed and I pulled the string and stent came out. Patient tolerated well.

## 2022-10-02 IMAGING — CT CT RENAL STONE PROTOCOL
2 of 4 series · 16 of 46 positions shown, 18 images · non-contrast
Comparison: 03/02/2015

CLINICAL DATA: Abdominal pain, history of kidney stones



[Series 2: stone full standard · axial · 0.91mm/px · z∈[-996,-536]mm · 13 of 102 slices shown, 15 images]
[im 5/102  soft-tissue]
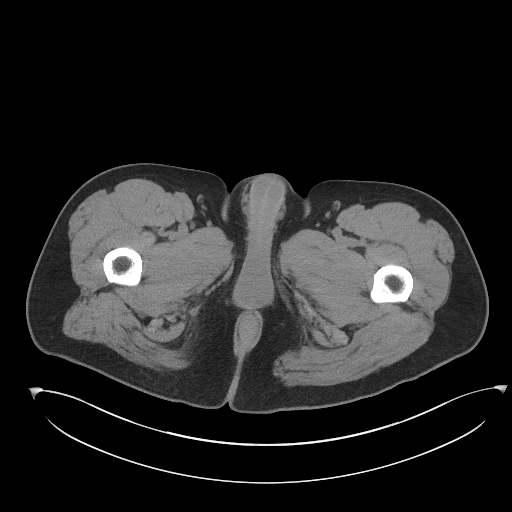
[im 5/102  bone]
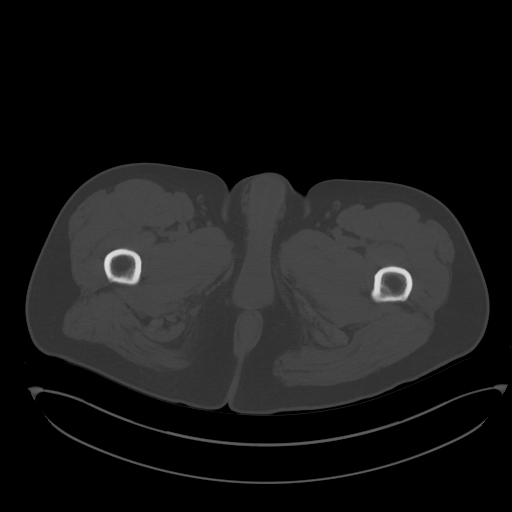
[im 14/102  soft-tissue]
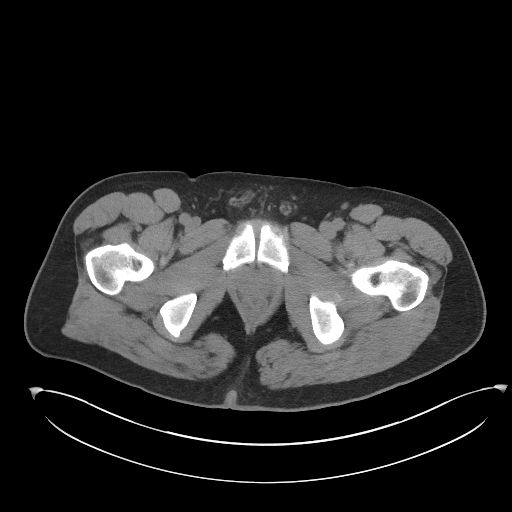
[im 22/102  soft-tissue]
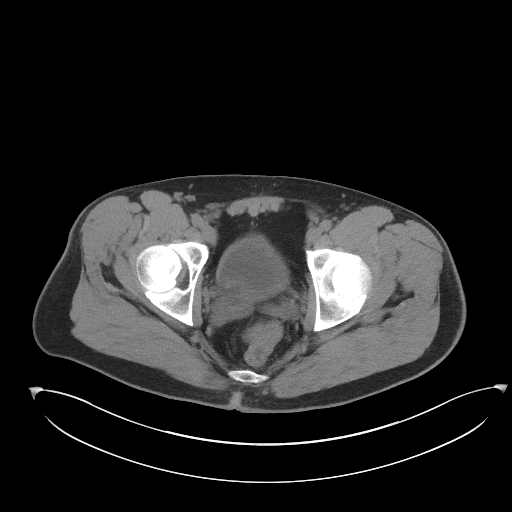
[im 27/102  soft-tissue]
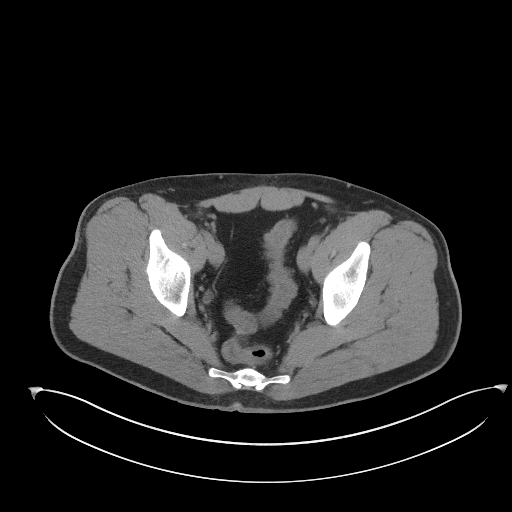
[im 36/102  soft-tissue]
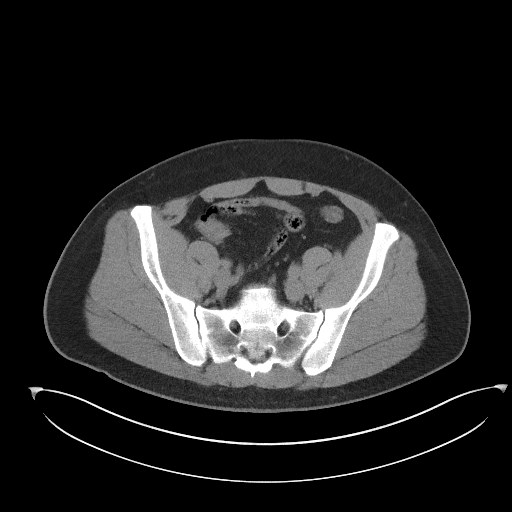
[im 44/102  soft-tissue]
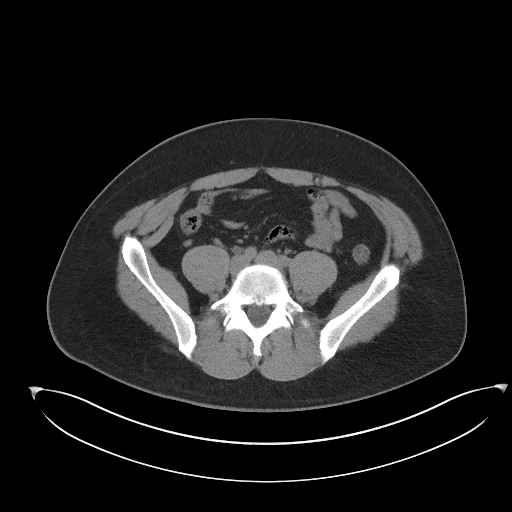
[im 53/102  soft-tissue]
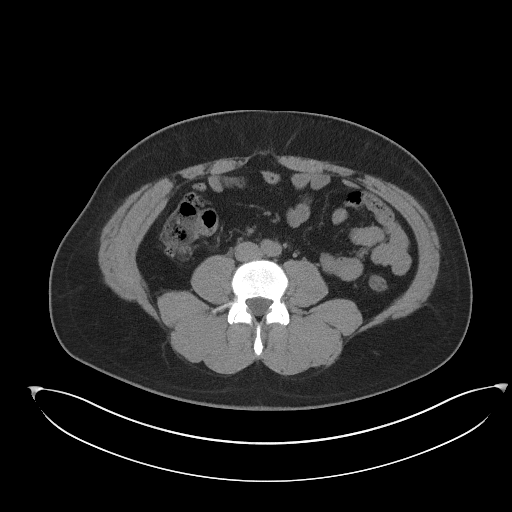
[im 58/102  soft-tissue]
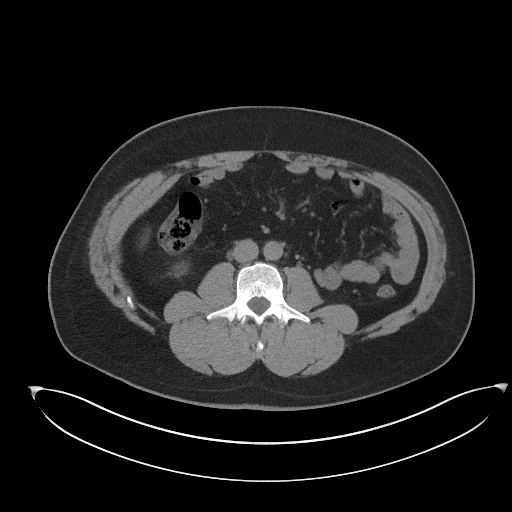
[im 66/102  soft-tissue]
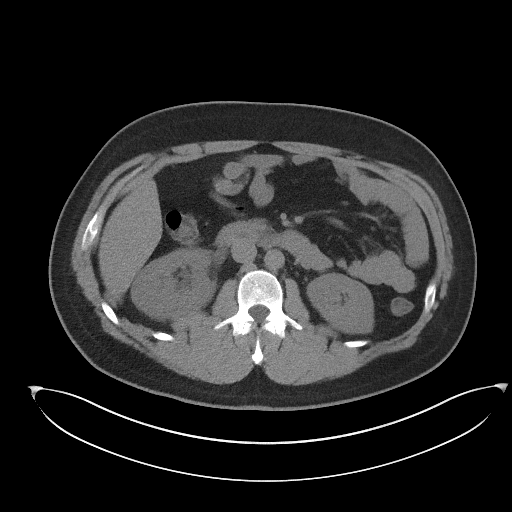
[im 66/102  bone]
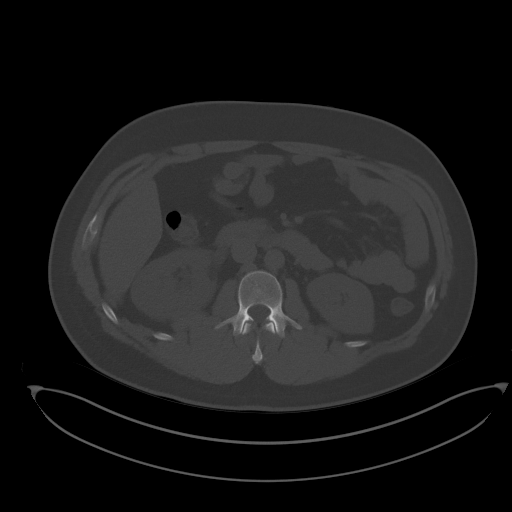
[im 75/102  soft-tissue]
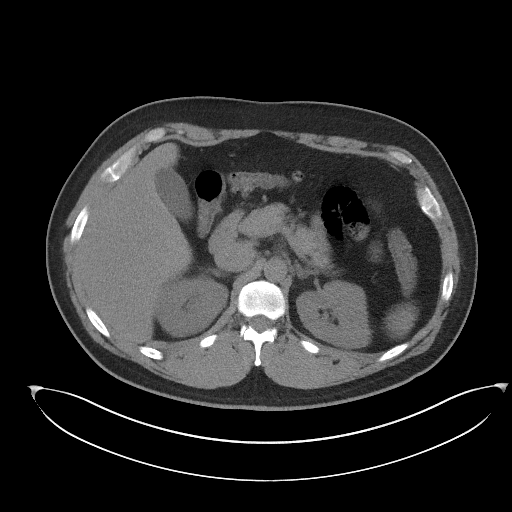
[im 80/102  soft-tissue]
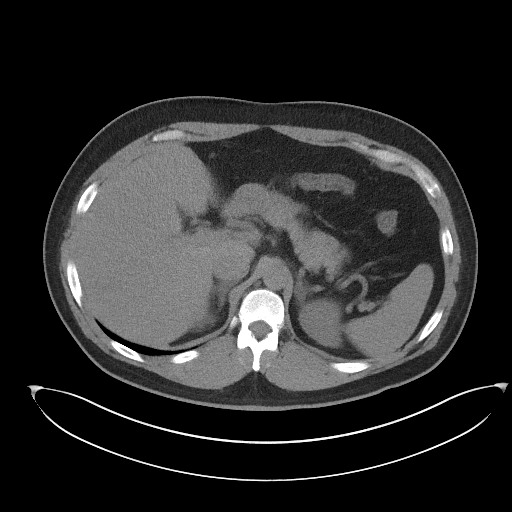
[im 88/102  soft-tissue]
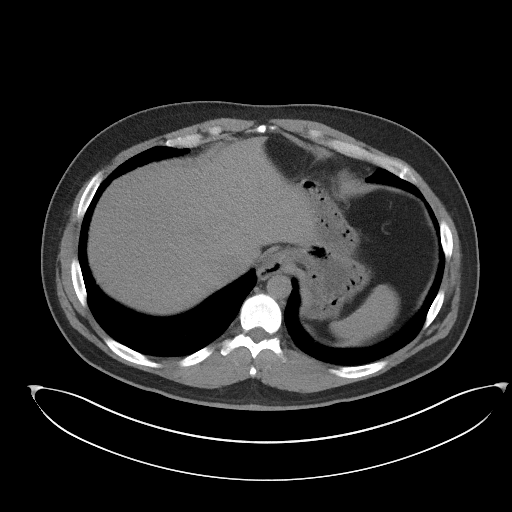
[im 97/102  soft-tissue]
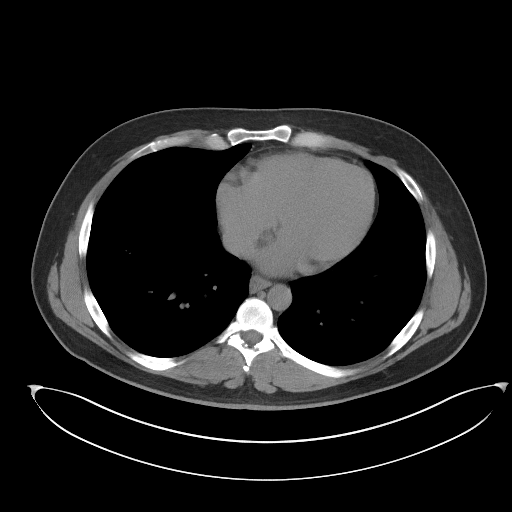

[Series 5: coronal · coronal · 0.83mm/px · 3 of 147 slices shown]
[im 49/147  soft-tissue]
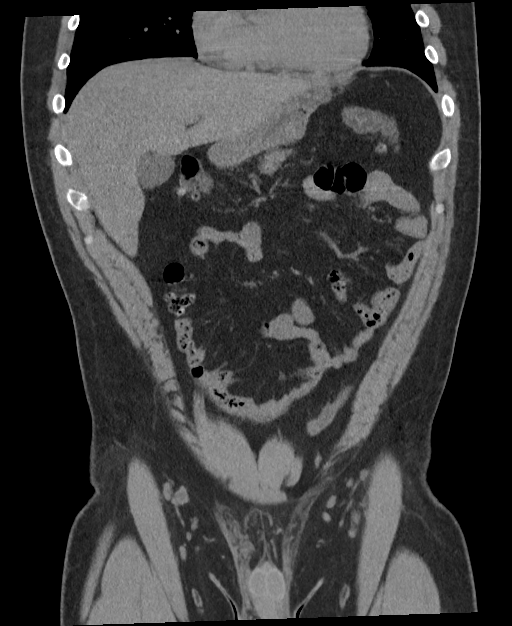
[im 65/147  soft-tissue]
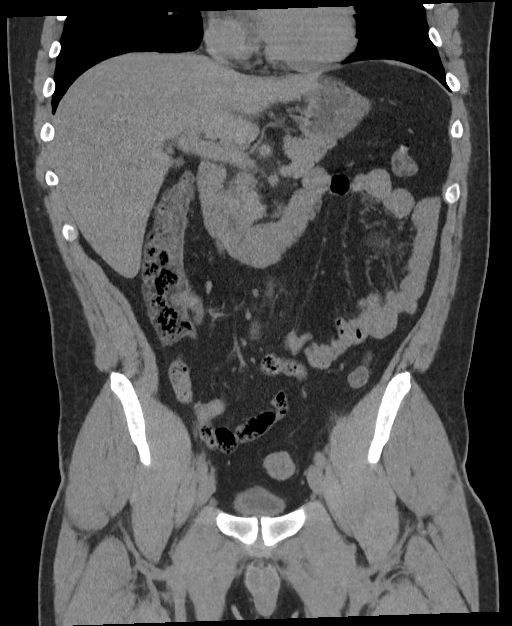
[im 82/147  soft-tissue]
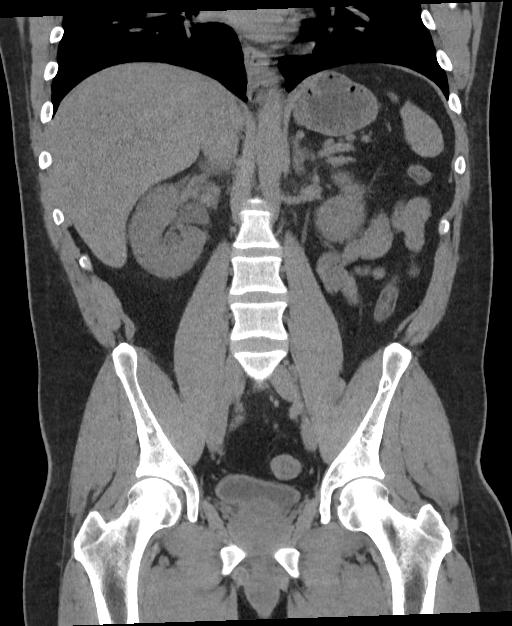

[16 of 46 positions shown; findings below may reference images not displayed]

FINDINGS: Lower chest: Lung bases are clear.

Hepatobiliary: Unenhanced liver is unremarkable.

Gallbladder is unremarkable. No intrahepatic or extrahepatic ductal
dilatation.

Pancreas: Within normal limits.

Spleen: Within normal limits.

Adrenals/Urinary Tract: Adrenal glands are within normal limits.

Left kidney is within normal limits.

Mild right perirenal edema with mild right hydronephrosis.

3 mm distal right ureteral calculus just above the UVJ (series
2/image 80).

Bladder is mildly thick-walled although underdistended.

Stomach/Bowel: Stomach is notable for a small hiatal hernia.

No evidence of bowel obstruction.

Normal appendix (series 2/image 57).

No colonic wall thickening or inflammatory changes.

Vascular/Lymphatic: No evidence of abdominal aortic aneurysm.

No suspicious abdominopelvic lymphadenopathy.

Reproductive: Mild prostatomegaly.

Other: No abdominopelvic ascites.

Musculoskeletal: Visualized osseous structures are within normal
limits.
IMPRESSION: 3 mm distal right ureteral calculus just above the UVJ. Mild right
hydronephrosis.

## 2022-10-10 IMAGING — US US RENAL
1 series · 14 of 25 positions shown · non-contrast
Comparison: CT renal stone 01/07/2022

CLINICAL DATA: Persistent severe pain. History of 3 mm stone in the
distal right ureter on prior CT 01/07/2022 with resulting mild right
hydronephrosis.

EXAM:
RENAL / URINARY TRACT ULTRASOUND COMPLETE

[Series 1: us renal · 14 of 121 slices shown]
[im 1/121]
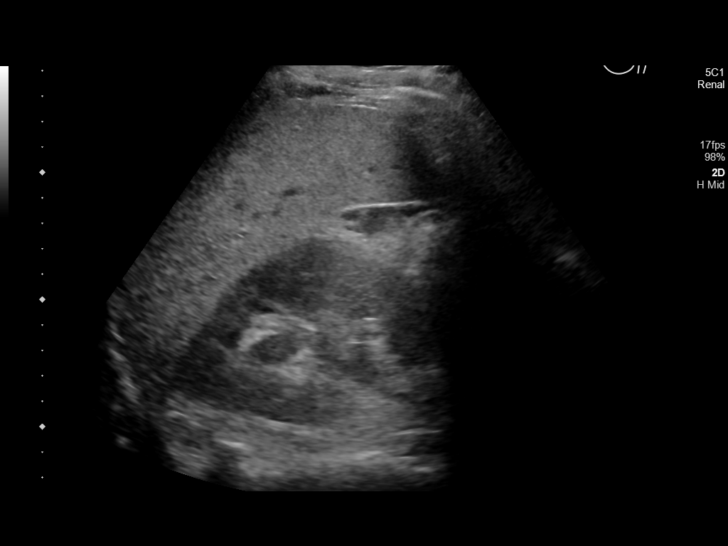
[im 11/121]
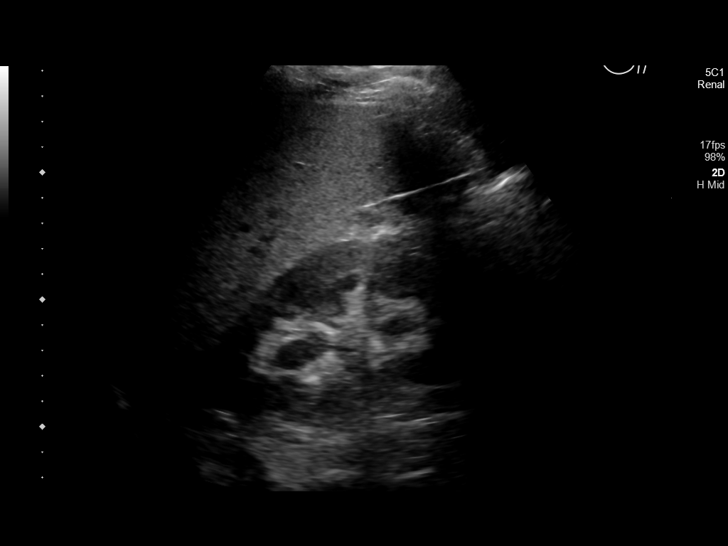
[im 21/121]
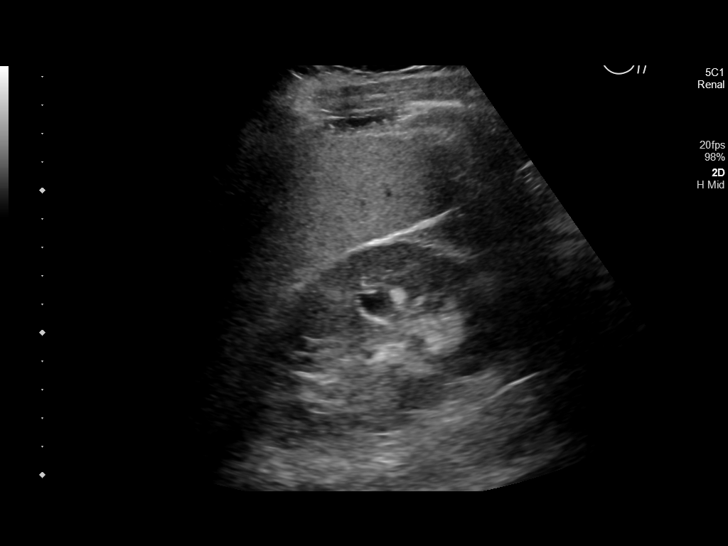
[im 31/121]
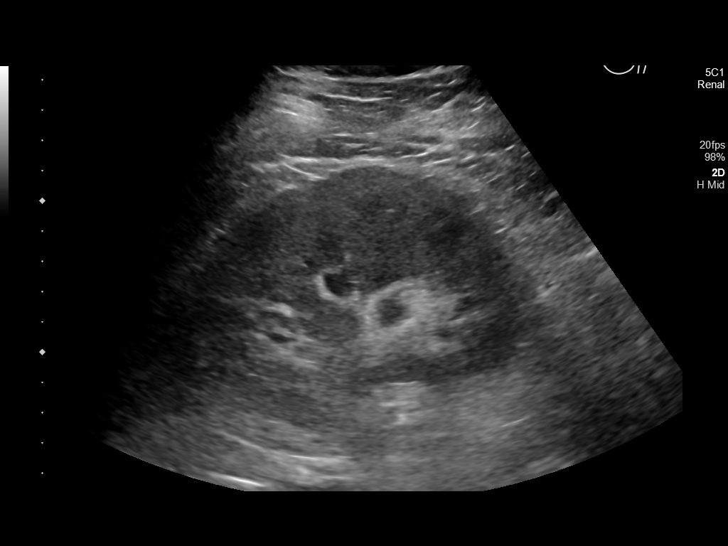
[im 41/121]
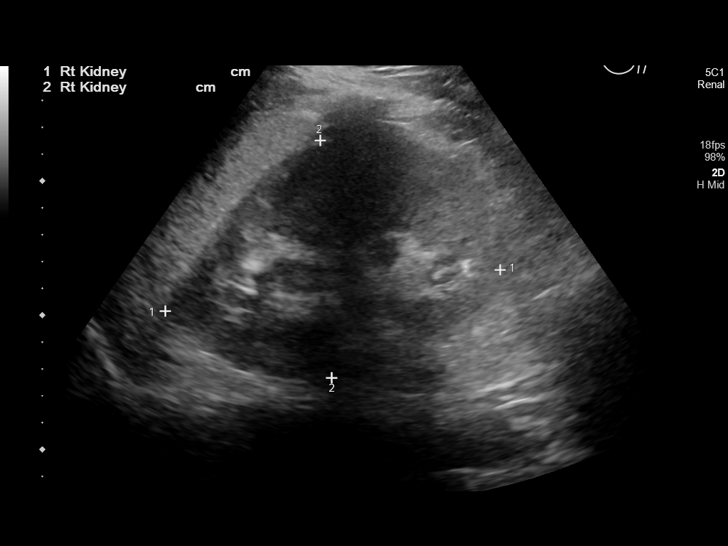
[im 46/121]
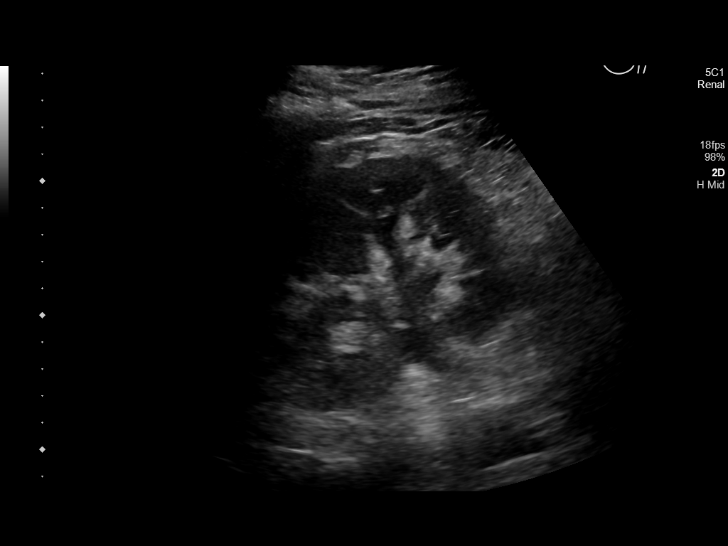
[im 56/121]
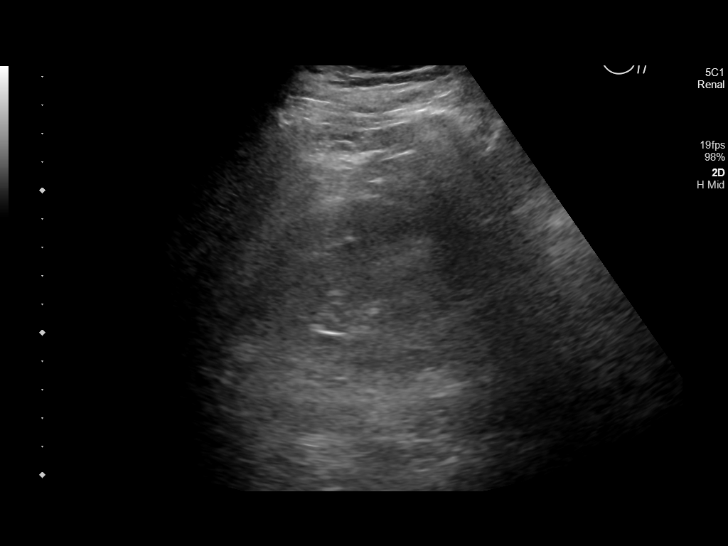
[im 66/121]
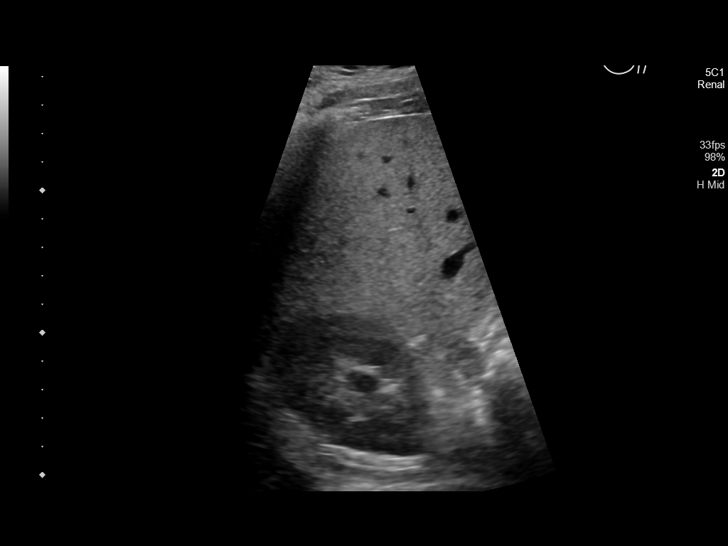
[im 76/121]
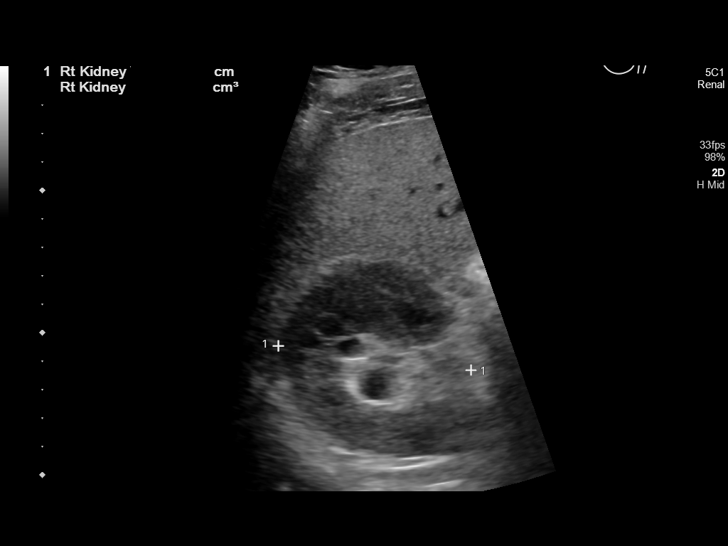
[im 81/121]
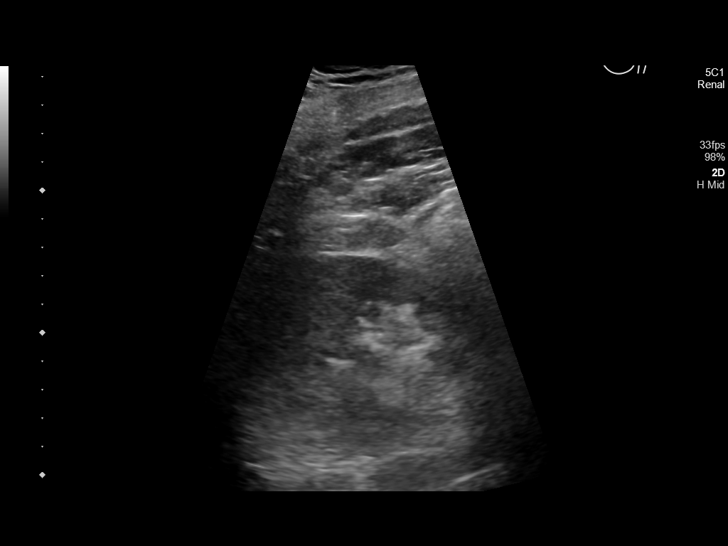
[im 91/121]
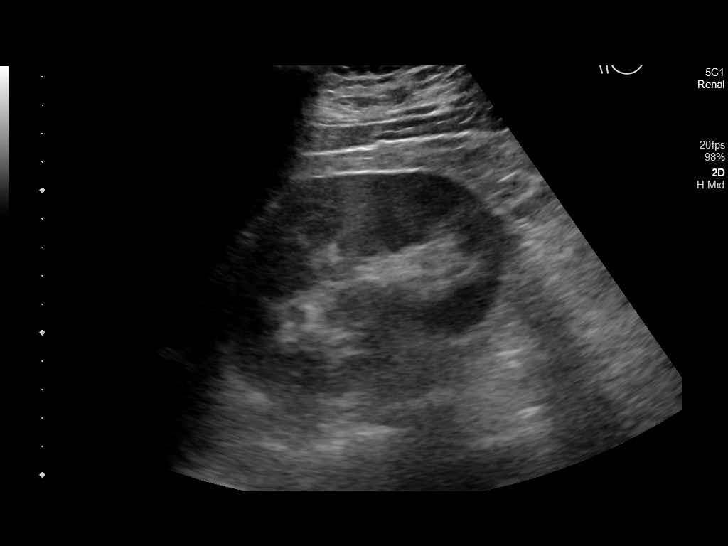
[im 101/121]
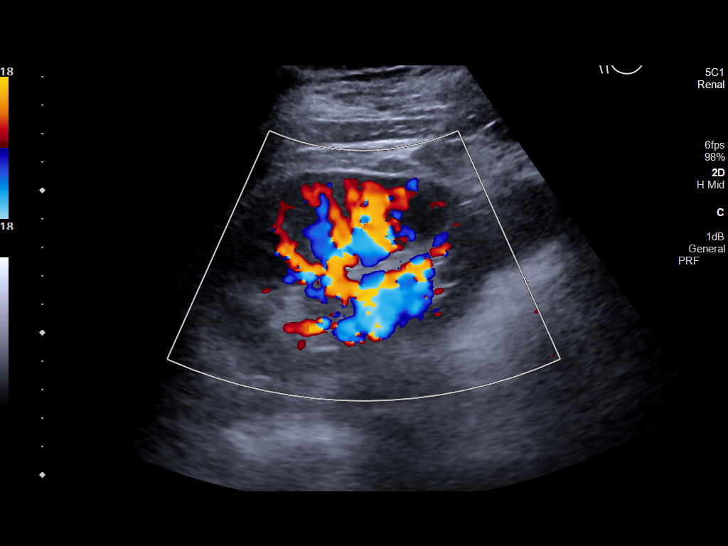
[im 111/121]
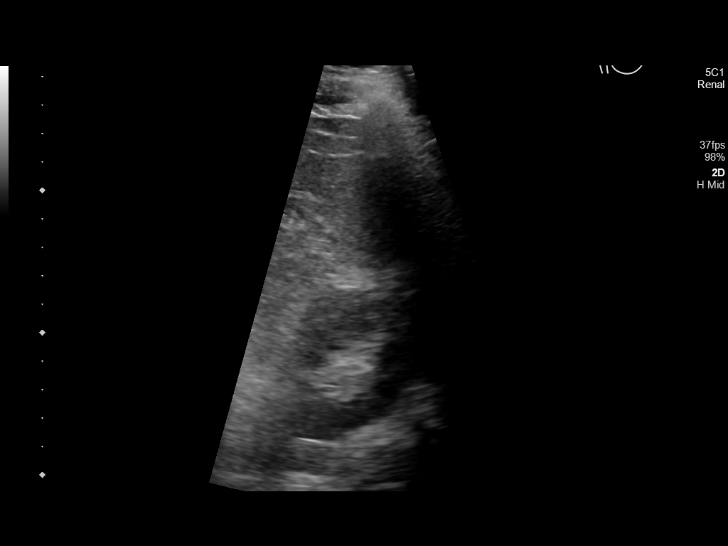
[im 121/121]
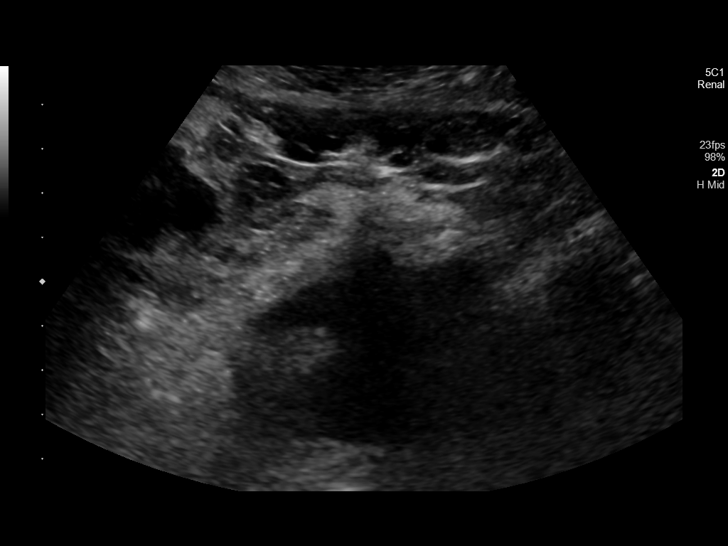

[14 of 25 positions shown; findings below may reference images not displayed]

FINDINGS: Right Kidney:

Renal measurements: 12.6 x 8.9 x 6.8 cm = volume: 396.9 mL.
Echogenicity within normal limits. No mass or hydronephrosis
visualized.

Left Kidney:

Renal measurements: 10.5 x 6.5 x 6.7 cm = volume: 239.4 mL.
Echogenicity within normal limits. No mass or hydronephrosis
visualized.

Bladder:

Patient voided prior to exam; bladder is not well visualized.

Other:

Diffuse increased echogenicity of the visualized portions of the
liver.
IMPRESSION: 1. Persistent mild right hydronephrosis.
2. Diffuse increased echogenicity of the visualized portions of the
liver, suggestive of hepatic steatosis.

## 2023-08-16 ENCOUNTER — Ambulatory Visit
Admission: EM | Admit: 2023-08-16 | Discharge: 2023-08-16 | Disposition: A | Discharge status: Home / Self Care | Payer: Self-pay | Attending: Physician Assistant | Admitting: Physician Assistant

## 2023-08-16 DIAGNOSIS — U071 COVID-19: Secondary | ICD-10-CM | POA: Insufficient documentation

## 2023-08-16 DIAGNOSIS — R0981 Nasal congestion: Secondary | ICD-10-CM | POA: Insufficient documentation

## 2023-08-16 DIAGNOSIS — R051 Acute cough: Secondary | ICD-10-CM | POA: Insufficient documentation

## 2023-08-16 LAB — SARS CORONAVIRUS 2 BY RT PCR: SARS Coronavirus 2 by RT PCR: POSITIVE — AB

## 2023-08-16 MED ORDER — PROMETHAZINE-DM 6.25-15 MG/5ML PO SYRP: 5.0000 mL | ORAL_SOLUTION | Freq: Four times a day (QID) | ORAL | 0 refills | Status: AC | PRN

## 2023-08-16 NOTE — ED Triage Notes (Signed)
Patient had a positive at home covid test. Needs one from Dr for work.

## 2023-08-16 NOTE — Discharge Instructions (Addendum)
-  COVID test is positive. - Need to isolate until you are feeling better, probably at least 2 or 3 more days.  Wear your mask as long as you are coughing. - May use over-the-counter cough medications or the 1 that has been prescribed. -You need to be seen again if you have uncontrollable fever, weakness or breathing difficulty

## 2023-08-16 NOTE — ED Provider Notes (Signed)
[facility]    CSN: [id number] Arrival date & time: 08/16/23  4742      History   Chief Complaint Chief Complaint  Patient presents with   Covid Positive    HPI JERIMI WRITE is a 42 y.o. male presenting for congestion and cough x 4 days.  Reports his mother recently tested positive for COVID and is currently admitted for pneumonia.  He says that she has COPD and has had some breathing issues.  Patient reports he was sick a little before her.  He reports taking a COVID test at home yesterday and states it was positive.  He reports that he informed his employer and they advised him to be evaluated in person.  Patient reports his symptoms have improved from onset.  He has not had any fevers, aches, fatigue, chest pain, shortness of breath, sore throat.  No vomiting or diarrhea.  Not currently taking any OTC meds.  No other complaints.  HPI  Past Medical History:  Diagnosis Date   History of kidney stones     There are no problems to display for this patient.   Past Surgical History:  Procedure Laterality Date   CYSTOSCOPY/URETEROSCOPY/HOLMIUM LASER/STENT PLACEMENT Right 01/21/2022   Procedure: CYSTOSCOPY/URETEROSCOPY/HOLMIUM LASER/STENT PLACEMENT;  Surgeon: Sondra Come, MD;  Location: [facility];  Service: Urology;  Laterality: Right;       Home Medications    Prior to Admission medications   Medication Sig Start Date End Date Taking? Authorizing Provider  promethazine-dextromethorphan (PROMETHAZINE-DM) 6.25-15 MG/5ML syrup Take 5 mLs by mouth 4 (four) times daily as needed. 08/16/23  Yes Eusebio Friendly B, PA-C  ketorolac (TORADOL) 10 MG tablet Take 1 tablet (10 mg total) by mouth every 6 (six) hours as needed. 01/18/22   Sondra Come, MD  ondansetron (ZOFRAN-ODT) 4 MG disintegrating tablet Allow 1-2 tablets to dissolve in your mouth every 8 hours as needed for nausea/vomiting 01/07/22   Loleta Rose, MD    Family History History reviewed. No pertinent  family history.  Social History Social History   Tobacco Use   Smoking status: Every Day    Current packs/day: 0.50    Types: Cigarettes    Passive exposure: Never  Vaping Use   Vaping status: Former  Substance Use Topics   Alcohol use: Not Currently   Drug use: Never     Allergies   Patient has no known allergies.   Review of Systems Review of Systems  Constitutional:  Negative for fatigue and fever.  HENT:  Positive for congestion and rhinorrhea. Negative for sinus pressure, sinus pain and sore throat.   Respiratory:  Positive for cough. Negative for shortness of breath.   Gastrointestinal:  Negative for abdominal pain, diarrhea, nausea and vomiting.  Musculoskeletal:  Negative for myalgias.  Neurological:  Negative for weakness, light-headedness and headaches.  Hematological:  Negative for adenopathy.     Physical Exam Triage Vital Signs ED Triage Vitals  Encounter Vitals Group     BP      Systolic BP Percentile      Diastolic BP Percentile      Pulse      Resp      Temp      Temp src      SpO2      Weight      Height      Head Circumference      Peak Flow      Pain Score  Pain Loc      Pain Education      Exclude from Growth Chart    No data found.  Updated Vital Signs BP (!) 145/102 (BP Location: Right Arm)   Pulse 85   Temp 98 F (36.7 C) (Oral)   Resp 20   Wt 200 lb (90.7 kg)   SpO2 98%   BMI 27.89 kg/m   Physical Exam Vitals and nursing note reviewed.  Constitutional:      General: He is not in acute distress.    Appearance: Normal appearance. He is well-developed. He is not ill-appearing.  HENT:     Head: Normocephalic and atraumatic.     Nose: Congestion present.     Mouth/Throat:     Mouth: Mucous membranes are moist.     Pharynx: Oropharynx is clear.  Eyes:     General: No scleral icterus.    Conjunctiva/sclera: Conjunctivae normal.  Cardiovascular:     Rate and Rhythm: Normal rate and regular rhythm.     Heart  sounds: Normal heart sounds.  Pulmonary:     Effort: Pulmonary effort is normal. No respiratory distress.     Breath sounds: Normal breath sounds.  Musculoskeletal:     Cervical back: Neck supple.  Skin:    General: Skin is warm and dry.     Capillary Refill: Capillary refill takes less than 2 seconds.  Neurological:     General: No focal deficit present.     Mental Status: He is alert. Mental status is at baseline.     Motor: No weakness.     Gait: Gait normal.  Psychiatric:        Mood and Affect: Mood normal.        Behavior: Behavior normal.      UC Treatments / Results  Labs (all labs ordered are listed, but only abnormal results are displayed) Labs Reviewed  SARS CORONAVIRUS 2 BY RT PCR - Abnormal; Notable for the following components:      Result Value   SARS Coronavirus 2 by RT PCR POSITIVE (*)    All other components within normal limits    EKG   Radiology No results found.  Procedures Procedures (including critical care time)  Medications Ordered in UC Medications - No data to display  Initial Impression / Assessment and Plan / UC Course  I have reviewed the triage vital signs and the nursing notes.  Pertinent labs & imaging results that were available during my care of the patient were reviewed by me and considered in my medical decision making (see chart for details).   42 year old male presents for 4 day history of congestion and cough.  Mother currently has COVID and he had a positive COVID test.  He states his employer wanted him to be evaluated in person.  Patient does not have any chronic health issues.  Patient is afebrile.  Blood pressure 157/118.  He is overall well-appearing.  Has mild nasal congestion.  Throat is clear.  Chest clear.  COVID test obtained per patient request.  COVID-positive.  Reviewed current CDC guidelines, isolation protocol and ED precautions.  Sent promethazine DM prn cough. Advised OTC Mucinex, rest, fluids, Flonase,  Tylenol/ Motrin.  Work note provided.  Final Clinical Impressions(s) / UC Diagnoses   Final diagnoses:  COVID-19  Acute cough  Nasal congestion     Discharge Instructions      -COVID test is positive. - Need to isolate until you are feeling better, probably at  least 2 or 3 more days.  Wear your mask as long as you are coughing. - May use over-the-counter cough medications or the 1 that has been prescribed. -You need to be seen again if you have uncontrollable fever, weakness or breathing difficulty      ED Prescriptions     Medication Sig Dispense Auth. Provider   promethazine-dextromethorphan (PROMETHAZINE-DM) 6.25-15 MG/5ML syrup Take 5 mLs by mouth 4 (four) times daily as needed. 118 mL Shirlee Latch, PA-C      PDMP not reviewed this encounter.   Shirlee Latch, PA-C 08/16/23 [phone]
